# Patient Record
Sex: Female | Born: 1955
Health system: Southern US, Community
[De-identification: ages and names within clinical notes are randomized; demographics above are authoritative.]

## PROBLEM LIST (undated history)

## (undated) DIAGNOSIS — E871 Hypo-osmolality and hyponatremia: Secondary | ICD-10-CM

## (undated) DIAGNOSIS — A6920 Lyme disease, unspecified: Secondary | ICD-10-CM

## (undated) DIAGNOSIS — D6800 Von Willebrand disease, unspecified: Secondary | ICD-10-CM

## (undated) DIAGNOSIS — C50919 Malignant neoplasm of unspecified site of unspecified female breast: Secondary | ICD-10-CM

## (undated) DIAGNOSIS — E039 Hypothyroidism, unspecified: Secondary | ICD-10-CM

## (undated) DIAGNOSIS — Z9189 Other specified personal risk factors, not elsewhere classified: Secondary | ICD-10-CM

## (undated) DIAGNOSIS — C801 Malignant (primary) neoplasm, unspecified: Secondary | ICD-10-CM

## (undated) DIAGNOSIS — I10 Essential (primary) hypertension: Secondary | ICD-10-CM

## (undated) HISTORY — DX: Von Willebrand disease, unspecified: D68.00

## (undated) HISTORY — DX: Von Willebrand's disease: D68.0

## (undated) HISTORY — DX: Lyme disease, unspecified: A69.20

## (undated) HISTORY — PX: BREAST LUMPECTOMY: SHX2

## (undated) HISTORY — DX: Other specified personal risk factors, not elsewhere classified: Z91.89

## (undated) HISTORY — PX: TONSILLECTOMY: SUR1361

---

## 1998-08-01 ENCOUNTER — Other Ambulatory Visit: Admission: RE | Admit: 1998-08-01 | Discharge: 1998-08-01 | Payer: Self-pay | Admitting: Obstetrics and Gynecology

## 1999-08-13 ENCOUNTER — Other Ambulatory Visit: Admission: RE | Admit: 1999-08-13 | Discharge: 1999-08-13 | Payer: Self-pay | Admitting: Obstetrics and Gynecology

## 2000-08-13 ENCOUNTER — Other Ambulatory Visit: Admission: RE | Admit: 2000-08-13 | Discharge: 2000-08-13 | Payer: Self-pay | Admitting: Obstetrics and Gynecology

## 2001-01-21 ENCOUNTER — Encounter: Payer: Self-pay | Admitting: Infectious Diseases

## 2001-01-21 ENCOUNTER — Ambulatory Visit (HOSPITAL_COMMUNITY): Admission: RE | Admit: 2001-01-21 | Discharge: 2001-01-21 | Payer: Self-pay | Admitting: Infectious Diseases

## 2001-04-27 ENCOUNTER — Ambulatory Visit (HOSPITAL_COMMUNITY): Admission: RE | Admit: 2001-04-27 | Discharge: 2001-04-27 | Payer: Self-pay | Admitting: Infectious Diseases

## 2001-04-27 ENCOUNTER — Encounter: Payer: Self-pay | Admitting: Infectious Diseases

## 2001-08-15 ENCOUNTER — Other Ambulatory Visit: Admission: RE | Admit: 2001-08-15 | Discharge: 2001-08-15 | Payer: Self-pay | Admitting: Obstetrics and Gynecology

## 2002-03-01 ENCOUNTER — Ambulatory Visit (HOSPITAL_COMMUNITY): Admission: RE | Admit: 2002-03-01 | Discharge: 2002-03-01 | Payer: Self-pay | Admitting: Infectious Diseases

## 2002-03-01 ENCOUNTER — Encounter: Payer: Self-pay | Admitting: Infectious Diseases

## 2002-09-19 ENCOUNTER — Other Ambulatory Visit: Admission: RE | Admit: 2002-09-19 | Discharge: 2002-09-19 | Payer: Self-pay | Admitting: Obstetrics and Gynecology

## 2003-01-23 ENCOUNTER — Encounter: Payer: Self-pay | Admitting: Infectious Diseases

## 2003-01-23 ENCOUNTER — Ambulatory Visit (HOSPITAL_COMMUNITY): Admission: RE | Admit: 2003-01-23 | Discharge: 2003-01-23 | Payer: Self-pay | Admitting: Infectious Diseases

## 2003-09-20 ENCOUNTER — Other Ambulatory Visit: Admission: RE | Admit: 2003-09-20 | Discharge: 2003-09-20 | Payer: Self-pay | Admitting: Obstetrics and Gynecology

## 2004-06-11 ENCOUNTER — Encounter: Admission: RE | Admit: 2004-06-11 | Discharge: 2004-06-11 | Payer: Self-pay | Admitting: General Surgery

## 2004-06-11 ENCOUNTER — Ambulatory Visit (HOSPITAL_COMMUNITY): Admission: RE | Admit: 2004-06-11 | Discharge: 2004-06-11 | Payer: Self-pay | Admitting: General Surgery

## 2004-06-13 ENCOUNTER — Ambulatory Visit: Payer: Self-pay | Admitting: Oncology

## 2004-07-04 ENCOUNTER — Ambulatory Visit (HOSPITAL_BASED_OUTPATIENT_CLINIC_OR_DEPARTMENT_OTHER): Admission: RE | Admit: 2004-07-04 | Discharge: 2004-07-04 | Payer: Self-pay | Admitting: General Surgery

## 2004-07-30 ENCOUNTER — Ambulatory Visit (HOSPITAL_COMMUNITY): Admission: RE | Admit: 2004-07-30 | Discharge: 2004-07-30 | Payer: Self-pay

## 2004-07-30 ENCOUNTER — Encounter (INDEPENDENT_AMBULATORY_CARE_PROVIDER_SITE_OTHER): Payer: Self-pay | Admitting: Specialist

## 2004-08-18 ENCOUNTER — Ambulatory Visit: Payer: Self-pay | Admitting: Oncology

## 2004-08-26 ENCOUNTER — Ambulatory Visit: Admission: RE | Admit: 2004-08-26 | Discharge: 2004-11-02 | Payer: Self-pay | Admitting: Radiation Oncology

## 2004-09-24 ENCOUNTER — Other Ambulatory Visit: Admission: RE | Admit: 2004-09-24 | Discharge: 2004-09-24 | Payer: Self-pay | Admitting: Obstetrics and Gynecology

## 2004-10-28 ENCOUNTER — Ambulatory Visit: Payer: Self-pay | Admitting: Oncology

## 2004-11-07 ENCOUNTER — Ambulatory Visit: Payer: Self-pay | Admitting: Internal Medicine

## 2004-11-18 ENCOUNTER — Ambulatory Visit: Payer: Self-pay | Admitting: Internal Medicine

## 2004-11-27 ENCOUNTER — Ambulatory Visit: Admission: RE | Admit: 2004-11-27 | Discharge: 2004-11-27 | Payer: Self-pay | Admitting: Radiation Oncology

## 2005-09-28 ENCOUNTER — Other Ambulatory Visit: Admission: RE | Admit: 2005-09-28 | Discharge: 2005-09-28 | Payer: Self-pay | Admitting: Obstetrics and Gynecology

## 2006-04-13 ENCOUNTER — Ambulatory Visit: Payer: Self-pay | Admitting: Internal Medicine

## 2007-08-03 ENCOUNTER — Other Ambulatory Visit: Admission: RE | Admit: 2007-08-03 | Discharge: 2007-08-03 | Payer: Self-pay | Admitting: *Deleted

## 2008-07-21 IMAGING — US US SOFT TISSUE HEAD/NECK
1 series · 13 of 25 positions shown · non-contrast
Comparison: NONE

CLINICAL DATA: Choking sensation.  Evaluate for enlarged 
thyroid.  

THYROID ULTRASOUND

[Series 1: us thyroid · 0.06mm/px · 13 of 36 slices shown]
[im 1/36]
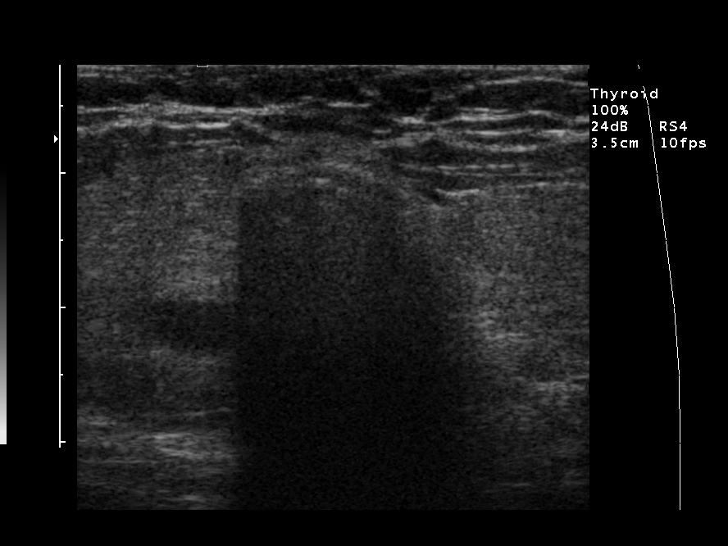
[im 3/36]
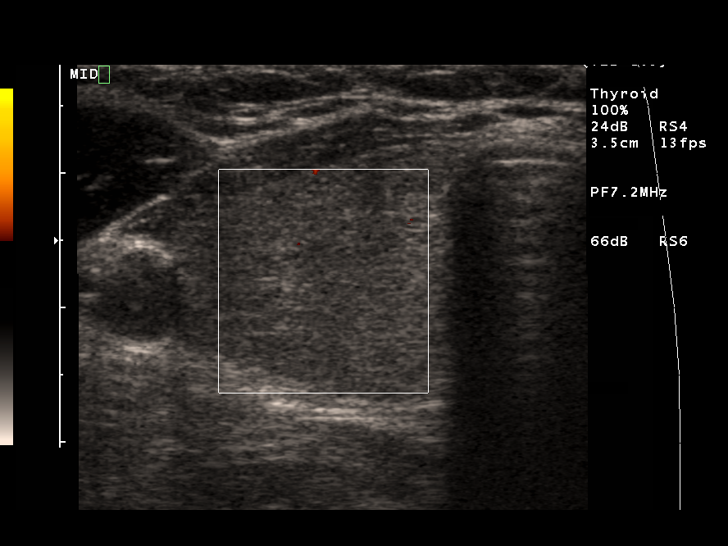
[im 6/36]
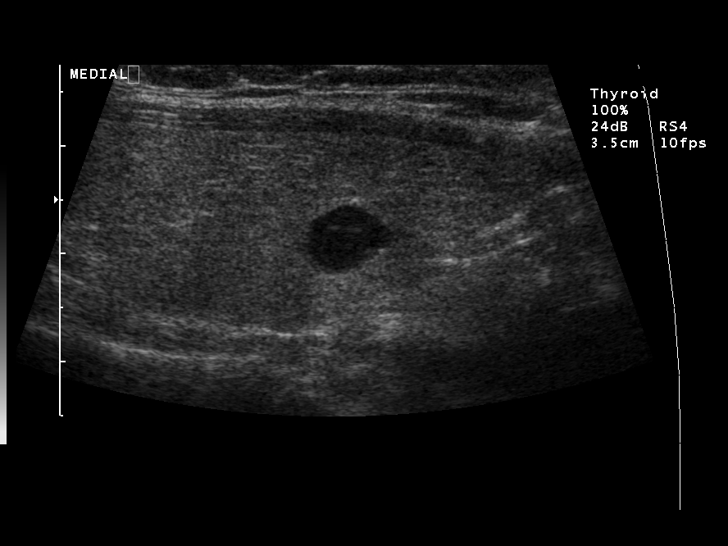
[im 9/36]
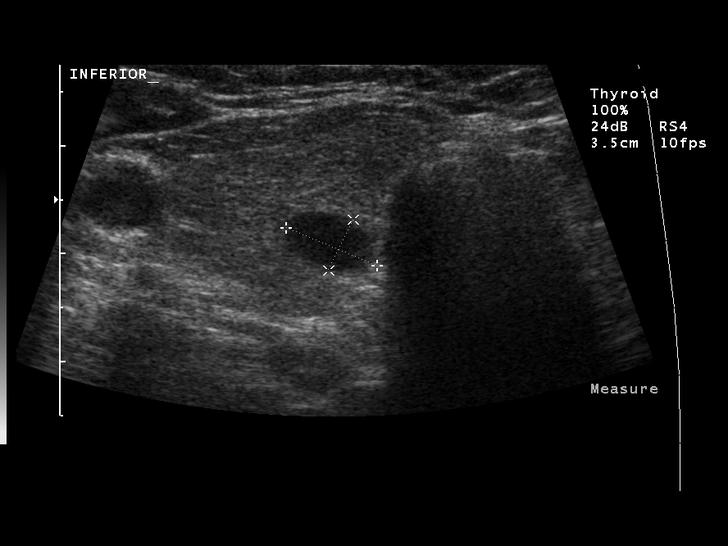
[im 12/36]
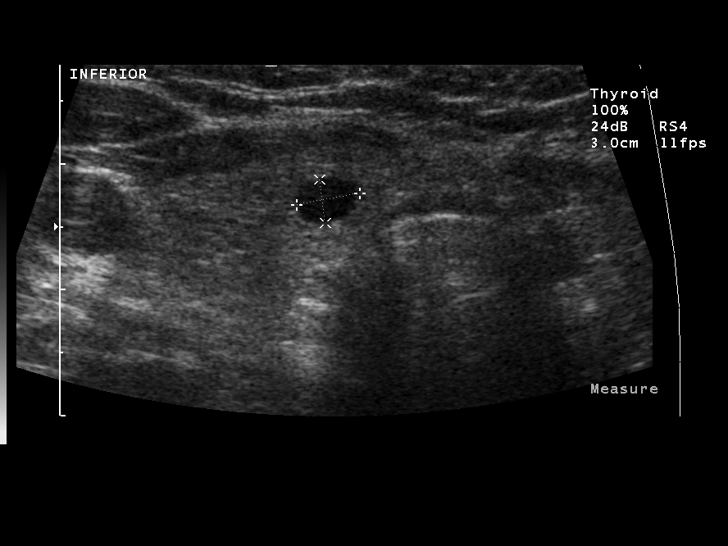
[im 15/36]
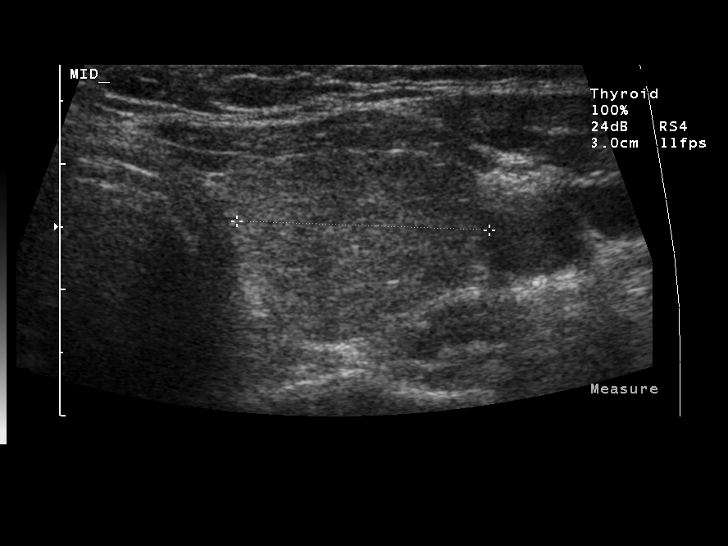
[im 18/36]
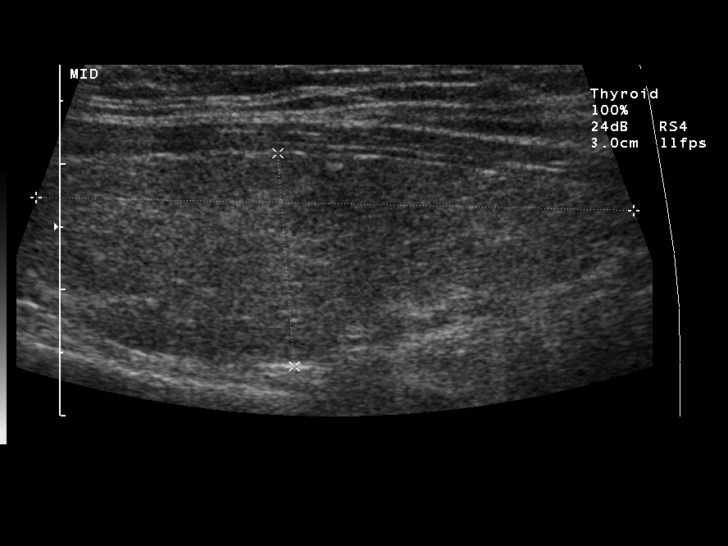
[im 21/36]
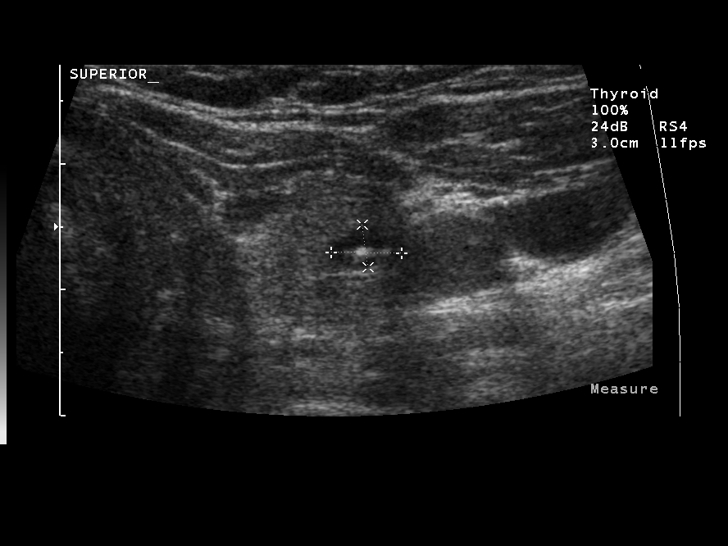
[im 24/36]
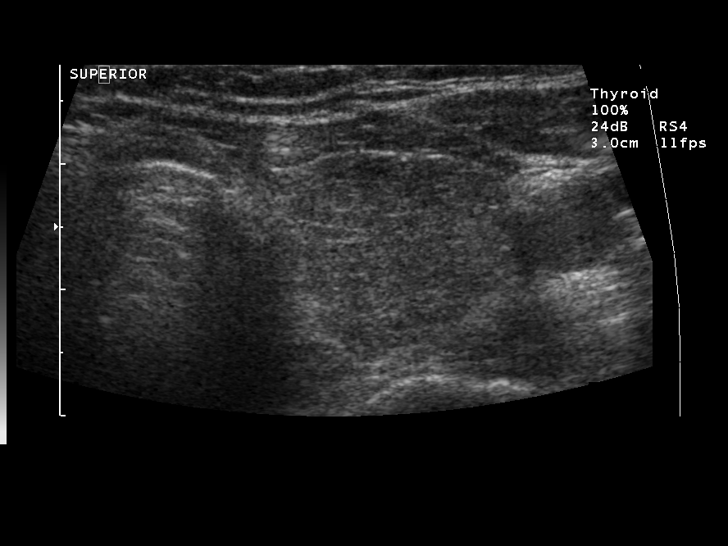
[im 27/36]
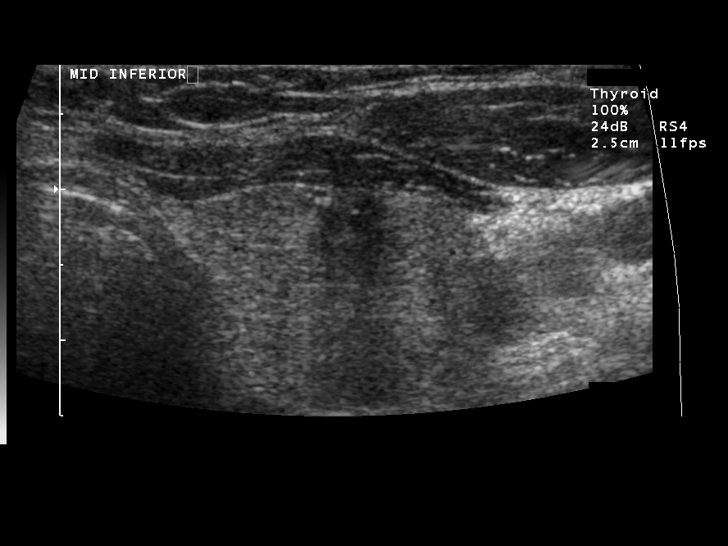
[im 30/36]
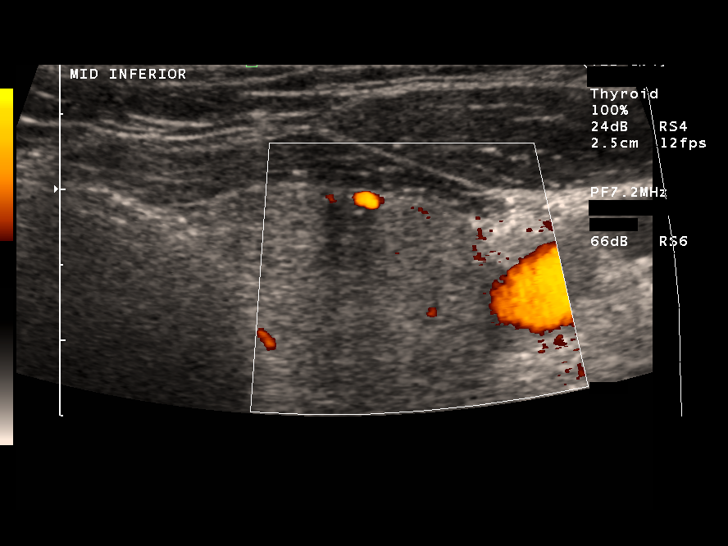
[im 33/36]
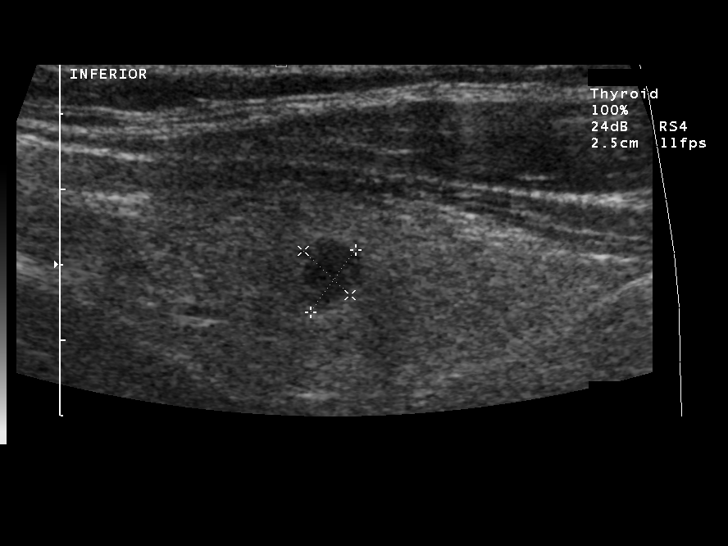
[im 36/36]
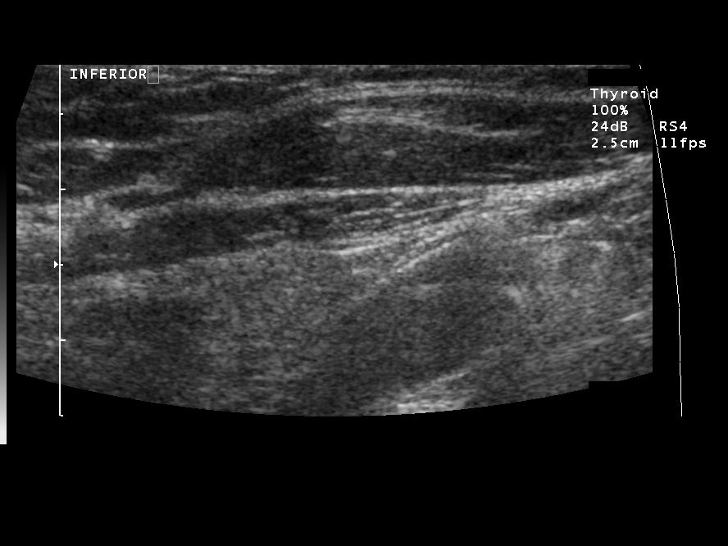

[13 of 25 positions shown; findings below may reference images not displayed]

FINDINGS: There are no prior ultrasound studies of the thyroid 
gland for comparison. Right lobe:  5.7x4.0x4.4 cm.  There is a 
hypoechoic area seen in the lower pole of the right lobe that 
measures approximately 7.9x8.9x09.9 mm.  There is a second 
hypoechoic lesion seen in the lower pole of the right lobe that 
measures 5.9x6.9x5.9 mm. Left lobe:  7.6x8.1x5.5 cm.   There is a 
hypoechoic area seen in the upper pole that measures 9.4x6.4x9.4 
mm.  This has a a hyperechoic area seen within it.  This is 
well-circumscribed.  There is an ill-defined hypoechoic area seen 
in the mid-to-lower pole that measures approximately 1.4x7.4 mm on 
axial image and approximately 4.0 mm on the sagittal image.  There 
is a second hypoechoic lesion seen in the lower pole of the left 
lobe that is noted and has a slightly irregular, but well-defined 
margin.  This measures 8.9x2.9x8.9 mm.
IMPRESSION: Thyroid gland appears to be upper limits of normal in 
size. There are multiple hypoechoic lesions seen in each lobe of 
the thyroid.  In the right lobe, these appear to be complex cysts. 
 In the left lobe, there is a lesion in the lower pole that may be 
a complex, but the other lesions appear to be solid.  I would 
recommend a follow-up thyroid ultrasound in 6 months to assess 
stability of these lesions, particularly the ill-defined 
hypoechoic lesion in the left lobe. Gala, Kimpee 
electronically reviewed on 06/02/2006 Dict Date: 06/02/2006  Tran 
Date: 06/02/2006 DAS  JLM

## 2010-06-22 ENCOUNTER — Encounter: Payer: Self-pay | Admitting: General Surgery

## 2010-08-26 ENCOUNTER — Other Ambulatory Visit: Payer: Self-pay | Admitting: Obstetrics and Gynecology

## 2010-10-17 NOTE — Assessment & Plan Note (Signed)
[facility]                           GASTROENTEROLOGY OFFICE NOTE   NAME:Marks, CRISS BARTLES                    MRN:          [id number]  DATE:04/13/2006                            DOB:          1956/03/02    CHIEF COMPLAINT:  Feeling of lump in throat, hoarseness, heartburn.   HISTORY:  A 55 year old self-referred [place] woman that I have seen for  screening colonoscopy recently.  For the past 4 months, she has had an  intermittent sensation of a lump in her throat.  There is no real dysphagia.  She has intermittent heartburn symptoms as well.  She just finished a 14-day  course of Prilosec OTC that seemed to help the heartburn but not the lump in  the throat.  There is some occasional postnasal drip but not a chronic  problem, some occasional allergies.  She relates she had problems similar to  this in 1998, when she had a problem with Lyme disease and even had a CT  scan of the neck, which is unrevealing.  She has not had problems since.  There is no weight loss or bleeding.   MEDICATIONS:  1. Aromasin 25 mg daily.  2. Mentax daily.  3. Propranolol 160 mg daily.  4. Armor thyroid 30 mg daily.  5. Provigil p.r.n. up to 200 mg.   DRUG ALLERGIES:  NONE KNOWN.   PAST MEDICAL HISTORY:  Breast cancer January of 2006, hypertension,  dyslipidemia, hypothyroidism, osteoarthritis.   PAST SURGICAL HISTORY:  Breast cancer surgery, colonoscopy showing mixed  hemorrhoid June 20, 2004.   FAMILY HISTORY:  Diabetes in her mother, heart disease in her mother.   SOCIAL HISTORY:  Married, lives with her husband and daughters.  She is a  disability advocate.  No alchohol, tobacco or drugs.   REVIEW OF SYSTEMS:  See medical history form for full details.  She has had  a little bit of hoarseness and sore throat.  At times, she cannot correlate  that with any particular events, however.   PHYSICAL EXAMINATION:  GENERAL:  Reveals a well-developed,  overweight to  obese [place] woman.  VITAL SIGNS:  Height 5 feet 10, weight 214 pounds, blood pressure 142/98,  pulse 71.  HEENT:  Eyes anicteric.  ENT:  Normal oropharynx.  NECK:  Supple, no rash.  CHEST:  Clear.  ABDOMEN:  Soft, nontender, no rebound or mass.  EXTREMITIES:  No edema.  SKIN:  No rash.  NEURO:  She is alert and oriented x3.   ASSESSMENT:  Globus sensation and heartburn problems.  The globus could be  from reflux.  I specifically asked her about anxiety, and I do not get a  sense that is part of it, though that can be related.  Postnasal drip  problems could be related.   PLAN:  1. Nexium 40 mg daily, taken before breakfast.  2. After 2 months of treatment, she will follow up, sooner if she develops      swallowing problems or bleeding.  If the symptoms resolve with the      Nexium, I think we have our answer.  Iva Boop, MD,FACG  Electronically Signed    CEG/MedQ  DD: 04/13/2006  DT: 04/13/2006  Job #: [id number]

## 2010-10-17 NOTE — Op Note (Signed)
NAME:  Catherine Robbins, Catherine Robbins             ACCOUNT NO.:  [id number]   MEDICAL RECORD NO.:  [id number]          PATIENT TYPE:  OIB   LOCATION:  2856                         FACILITY:  [facility]   PHYSICIAN:  Lorre Munroe., M.D.DATE OF BIRTH:  11/08/1955   DATE OF PROCEDURE:  07/30/2004  DATE OF DISCHARGE:                                 OPERATIVE REPORT   REFERRING PHYSICIAN:  Timothy E. Earlene Plater, M.D.   PREOPERATIVE DIAGNOSIS:  Carcinoma of the right breast.   POSTOPERATIVE DIAGNOSIS:  Carcinoma of the right breast.   OPERATION:  Right partial mastectomy and sentinel lymph node biopsy with  blue dye injection.   SURGEON:  Lebron Conners, M.D.   ANESTHESIA:  General and local.   PROCEDURE:  After the patient was monitored and anesthetized. I injected the  blue dye beneath the nipple and areola of the right breast and it was  massaged to promote drainage to the axilla. I used the Neoprobe to assure  that there was a area of increased radioactivity in the right axilla. After  routine preparation and draping of the right anterior chest, breast and  axilla, I made a short low axillary incision over the area of greatest  activity and dissected toward the radioactivity and found a large blue lymph  node which was highly radioactive and I removed it and sent it for  examination. I did not find any persistent area of increased radioactivity  in the axilla. I closed the deeper tissues of the axilla 4-0 Vicryl and skin  with running intracuticular 4-0 Vicryl. Dr. Delila Spence reported that imprint  cytology was negative for cancer. We then made a transverse incision at the  site of wire localization in the medial aspect of the right breast and  developed skin and subcutaneous flaps which were quite thin laterally for  about 3 cm in each direction. I incised straight down to the chest wall  medially and then dissected down skin flaps toward the middle part of the  breast and then deepened the dissection  making sure that I entirely excised  the localizing wire. I got hemostasis with cautery and closed that incision  with intracuticular 4-0 Vicryl and Steri-Strips after injecting  local anesthetic. I then also injected local anesthetic in the axillary  incision as well. Dr. Yolanda Bonine reported that the margins looked good and  that the abnormality was contained well within the biopsy specimen. Dr.  Delila Spence reported that the margin touch preps were negative for tumor. The the  patient tolerated the operation well.      WB/MEDQ  D:  07/30/2004  T:  07/30/2004  Job:  [id number]   cc:   Sheppard Plumber. Earlene Plater, M.D.  [address]  [place]  [place] [address]   Jeralyn Ruths, M.D.   [facility]  [address]  [place]  [place] [address]  Fax: [phone]   [facility]   Macarthur Critchley. Shelva Majestic, M.D.  [address] [place]  [address]  [place]  [place] [address]  Fax: [phone]

## 2011-04-20 ENCOUNTER — Telehealth: Payer: Self-pay | Admitting: *Deleted

## 2011-04-20 NOTE — Telephone Encounter (Signed)
Chart opened in error

## 2012-12-07 ENCOUNTER — Other Ambulatory Visit: Payer: Self-pay | Admitting: Family Medicine

## 2012-12-07 DIAGNOSIS — E049 Nontoxic goiter, unspecified: Secondary | ICD-10-CM

## 2012-12-09 ENCOUNTER — Ambulatory Visit
Admission: RE | Admit: 2012-12-09 | Discharge: 2012-12-09 | Disposition: A | Discharge status: Home / Self Care | Payer: BC Managed Care – PPO | Source: Ambulatory Visit | Attending: Family Medicine | Admitting: Family Medicine

## 2012-12-09 DIAGNOSIS — E049 Nontoxic goiter, unspecified: Secondary | ICD-10-CM

## 2012-12-29 ENCOUNTER — Other Ambulatory Visit: Payer: Self-pay

## 2014-01-23 ENCOUNTER — Other Ambulatory Visit: Payer: Self-pay | Admitting: Obstetrics and Gynecology

## 2014-01-23 DIAGNOSIS — R928 Other abnormal and inconclusive findings on diagnostic imaging of breast: Secondary | ICD-10-CM

## 2014-01-30 ENCOUNTER — Ambulatory Visit
Admission: RE | Admit: 2014-01-30 | Discharge: 2014-01-30 | Disposition: A | Discharge status: Home / Self Care | Payer: BC Managed Care – PPO | Source: Ambulatory Visit | Attending: Obstetrics and Gynecology | Admitting: Obstetrics and Gynecology

## 2014-01-30 DIAGNOSIS — R928 Other abnormal and inconclusive findings on diagnostic imaging of breast: Secondary | ICD-10-CM

## 2014-08-14 ENCOUNTER — Other Ambulatory Visit: Payer: Self-pay | Admitting: Obstetrics and Gynecology

## 2014-08-14 DIAGNOSIS — N63 Unspecified lump in unspecified breast: Secondary | ICD-10-CM

## 2014-08-22 ENCOUNTER — Ambulatory Visit
Admission: RE | Admit: 2014-08-22 | Discharge: 2014-08-22 | Disposition: A | Discharge status: Home / Self Care | Payer: BC Managed Care – PPO | Source: Ambulatory Visit | Attending: Obstetrics and Gynecology | Admitting: Obstetrics and Gynecology

## 2014-08-22 DIAGNOSIS — N63 Unspecified lump in unspecified breast: Secondary | ICD-10-CM

## 2015-01-01 IMAGING — US US SOFT TISSUE HEAD/NECK
1 series · 14 of 25 positions shown · non-contrast
Comparison: None.

CLINICAL DATA: Thyroid goiter, follow-up

THYROID ULTRASOUND
TECHNIQUE: Ultrasound examination of the thyroid gland and adjacent
soft tissues was performed.

[Series 1: us soft tissue head/neck · 0.07mm/px · 14 of 52 slices shown]
[im 1/52]
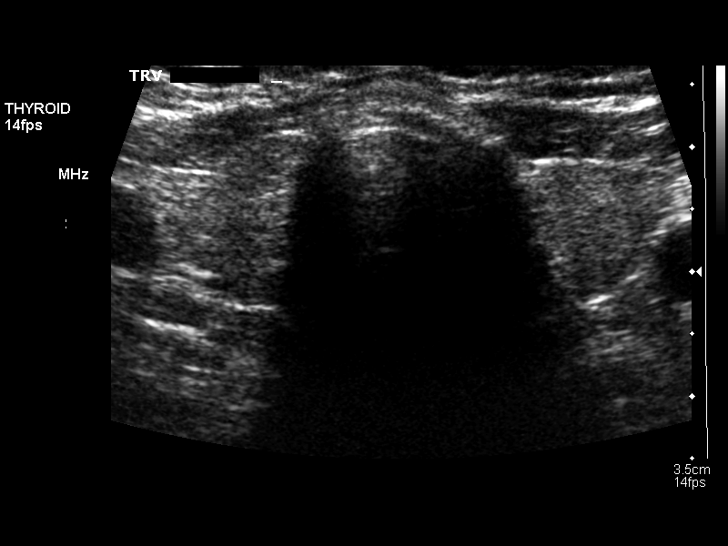
[im 5/52]
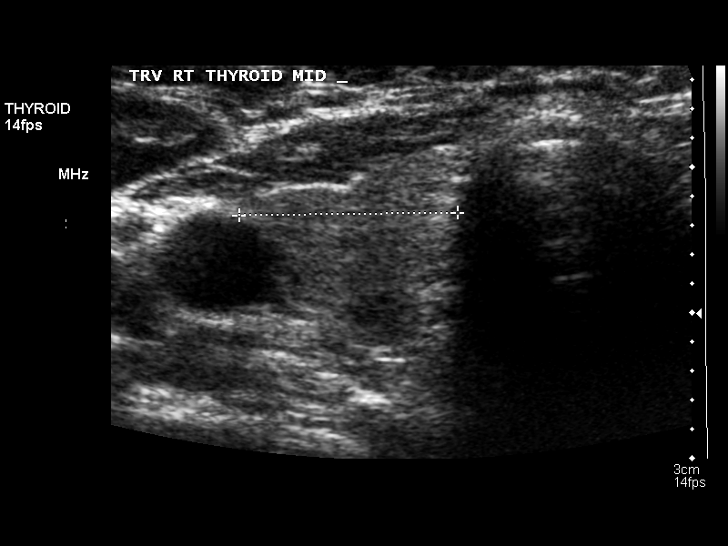
[im 9/52]
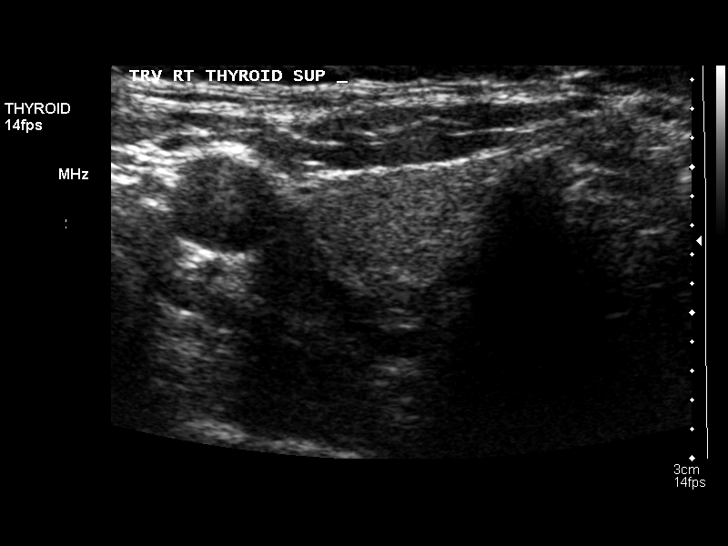
[im 13/52]
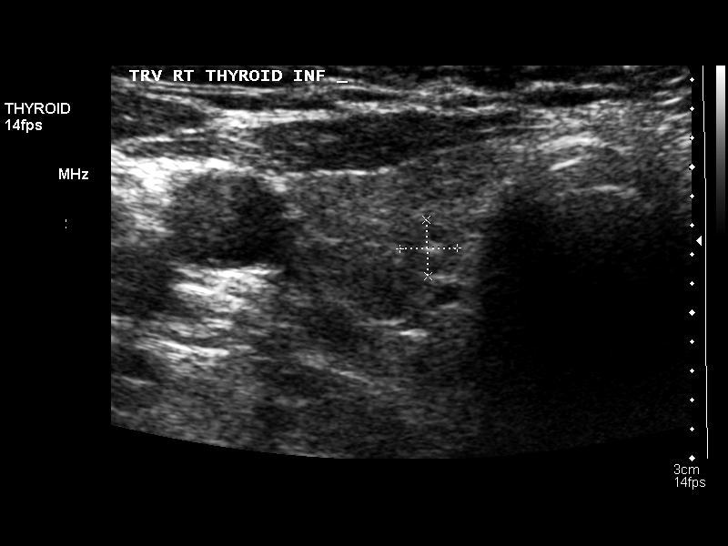
[im 18/52]
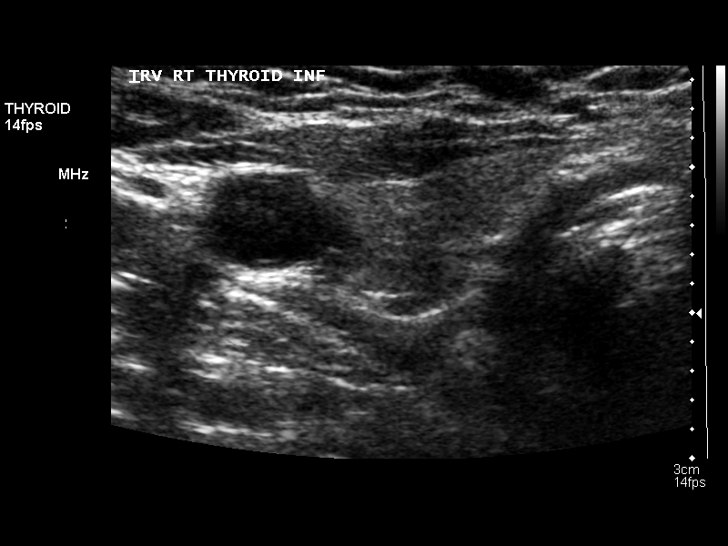
[im 20/52]
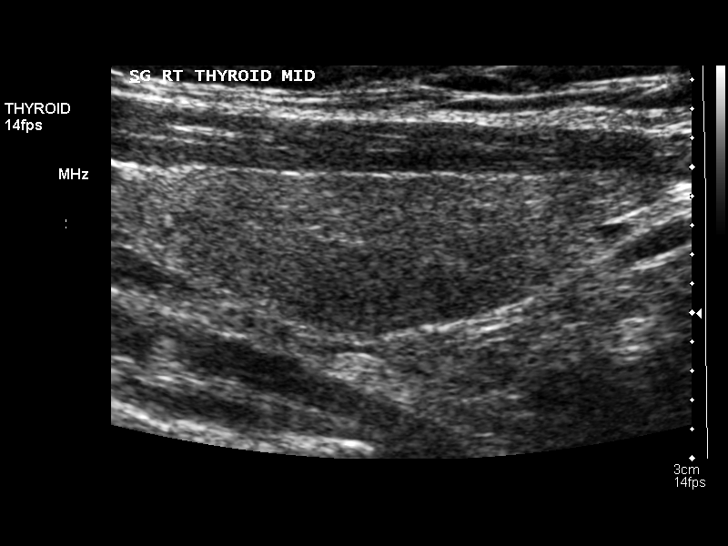
[im 24/52]
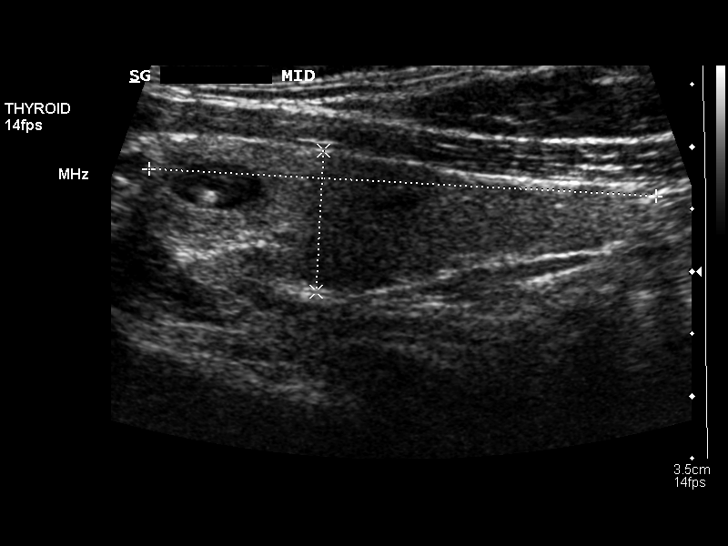
[im 28/52]
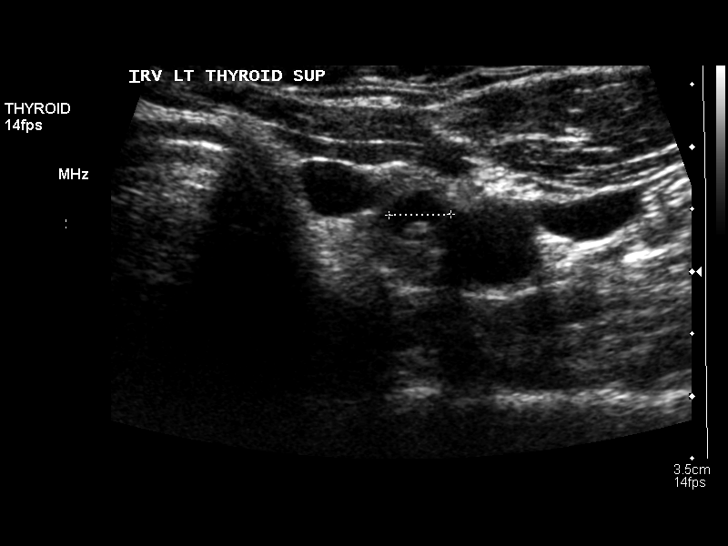
[im 32/52]
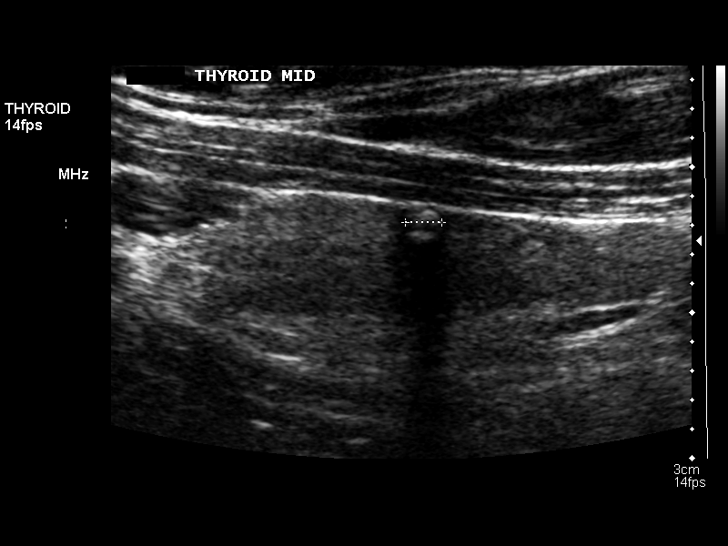
[im 35/52]
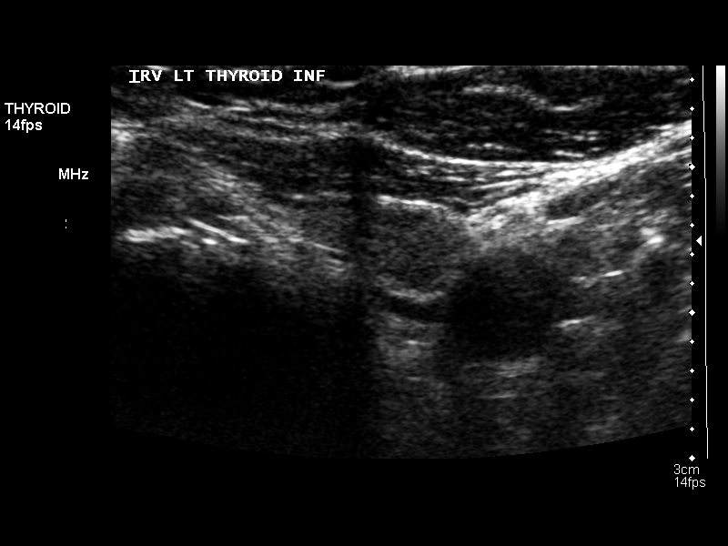
[im 39/52]
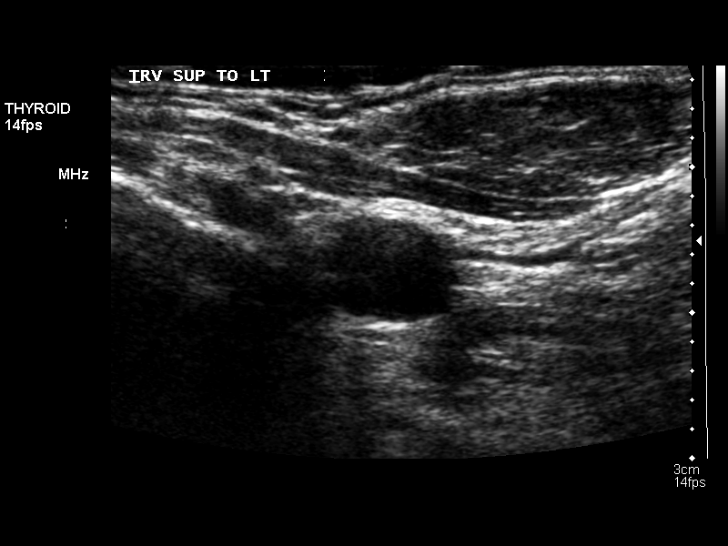
[im 43/52]
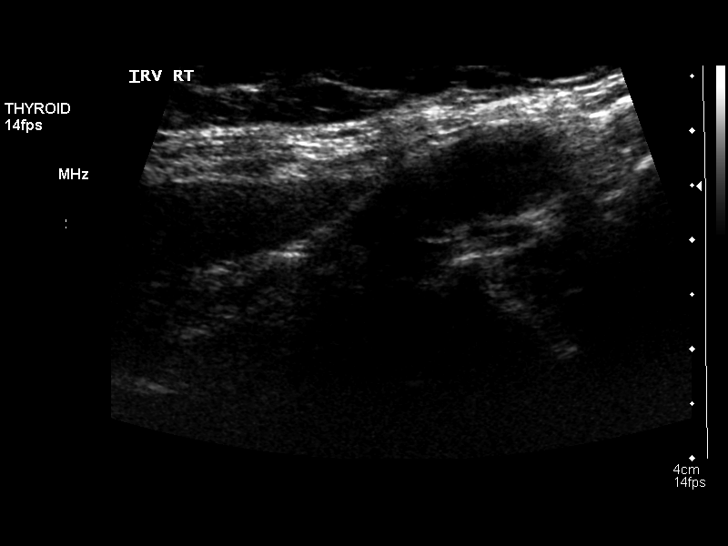
[im 47/52]
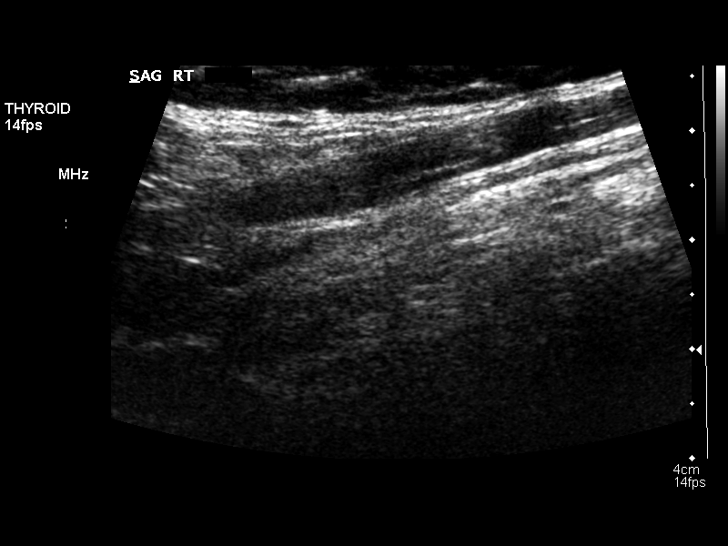
[im 52/52]
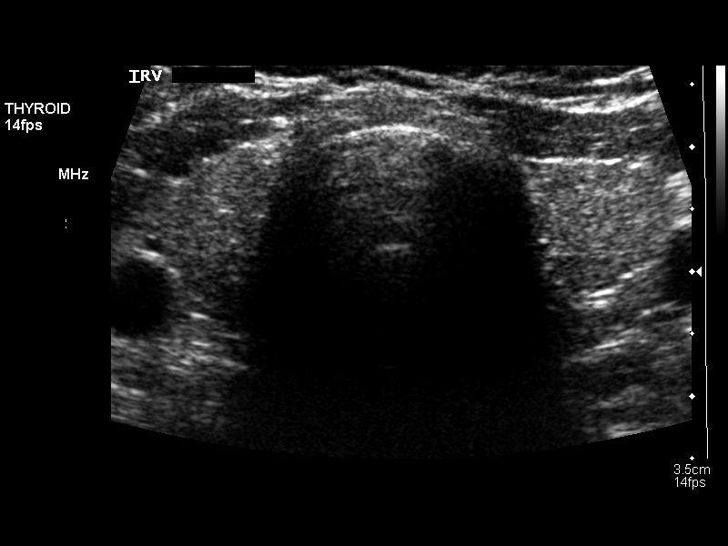

[14 of 25 positions shown; findings below may reference images not displayed]

FINDINGS: Right thyroid lobe:  3.8 x 1.3 x 1.5 cm.
Left thyroid lobe:  4.1 x 1.1 x 1.2 cm.
Isthmus:  2.1 mm in thickness.

Focal nodules:  The echogenicity of the thyroid parenchyma is
inhomogeneous.  Small nodules are noted bilaterally.  Largest
nodule is in the upper pole on the left and is hypoechoic measuring
5 mm in diameter.

Lymphadenopathy:  None visualized.
IMPRESSION: The thyroid gland is normal in size with only small nodules
bilaterally of no more than 5 mm in diameter.

## 2015-01-30 ENCOUNTER — Encounter: Payer: Self-pay | Admitting: Internal Medicine

## 2015-02-01 ENCOUNTER — Encounter: Payer: Self-pay | Admitting: Internal Medicine

## 2015-08-06 ENCOUNTER — Other Ambulatory Visit: Payer: Self-pay | Admitting: Family Medicine

## 2015-08-06 ENCOUNTER — Other Ambulatory Visit (HOSPITAL_COMMUNITY)
Admission: RE | Admit: 2015-08-06 | Discharge: 2015-08-06 | Disposition: A | Discharge status: Home / Self Care | Payer: BC Managed Care – PPO | Source: Ambulatory Visit | Attending: Family Medicine | Admitting: Family Medicine

## 2015-08-06 DIAGNOSIS — Z01419 Encounter for gynecological examination (general) (routine) without abnormal findings: Secondary | ICD-10-CM | POA: Diagnosis not present

## 2015-08-09 LAB — CYTOLOGY - PAP

## 2016-09-17 ENCOUNTER — Other Ambulatory Visit: Payer: Self-pay | Admitting: Physician Assistant

## 2016-09-17 DIAGNOSIS — M5412 Radiculopathy, cervical region: Secondary | ICD-10-CM

## 2016-09-30 ENCOUNTER — Other Ambulatory Visit: Payer: BC Managed Care – PPO

## 2017-08-17 ENCOUNTER — Other Ambulatory Visit: Payer: Self-pay | Admitting: Radiology

## 2017-08-18 ENCOUNTER — Other Ambulatory Visit: Payer: Self-pay | Admitting: Oncology

## 2017-08-18 ENCOUNTER — Telehealth: Payer: Self-pay | Admitting: Oncology

## 2017-08-18 NOTE — Telephone Encounter (Signed)
Spoke with patient to confirm afternoon [facility] appointment for 3/27, [facility] patient no packet sent, appointment letter mailed

## 2017-08-20 ENCOUNTER — Other Ambulatory Visit: Payer: Self-pay | Admitting: *Deleted

## 2017-08-20 DIAGNOSIS — Z170 Estrogen receptor positive status [ER+]: Principal | ICD-10-CM

## 2017-08-20 DIAGNOSIS — C50411 Malignant neoplasm of upper-outer quadrant of right female breast: Secondary | ICD-10-CM | POA: Insufficient documentation

## 2017-08-24 NOTE — Progress Notes (Signed)
[place]  [phone] Fax:(336) [phone]     ID: Catherine Robbins DOB: [birth year]  MR#: [id number]  JYN#:[id number]  Patient Care Team: Carol Ada, MD as PCP - General (Family Medicine) Jovita Kussmaul, MD as Consulting Physician (General Surgery) Magrinat, Virgie Dad, MD as Consulting Physician (Oncology) Kyung Rudd, MD as Consulting Physician (Radiation Oncology) Milly Jakob, MD as Consulting Physician (Orthopedic Surgery) OTHER MD:  CHIEF COMPLAINT: Estrogen receptor positive breast cancer  CURRENT TREATMENT: Awaiting definitive surgery/neoadjuvant anastrozole   HISTORY OF CURRENT ILLNESS: Catherine Robbins has a history of prior right-sided breast cancer, status post lumpectomy on 07/30/2004 for a 1.1 cm invasive ductal carcinoma, grade 1, with negative margins and a single sentinel lymph node clear.  The tumor was estrogen receptor 93% positive, progesterone receptor 83% positive, with an MIB-1 of 7% and no HER-2 amplification ([id number]).  She received adjuvant radiation radiation. She took tamoxifen for only a month due to poor tolerance, including severe hot flashes. She was switched to exemestane and tolerated this well, taking it for a total of 5 years  More recently she had routine screening mammography on 08/11/2017 showing a possible abnormality in the right breast. She underwent unilateral right diagnostic mammography with tomography and right breast ultrasonography at [facility] on 08/13/2017 showing: breast density category A. By ultrasound, the new 2.6 cm irregular mass in the 11 o'clock right upper outer quadrant  posterior depth located 4 cm from the nipple is likely carcinoma. Additionally, there is a 0.4 cm irregular mass in the right upper outer quadrant anterior depth and 3 cm from the nipple. These masses correlated to mammography findings. Evaluation of the right axilla was unremarkable.  Accordingly on 08/17/2017 she proceeded to biopsy  of the right breast mass in question. The pathology from this procedure showed ([id number]): At the 11 o'clock 4 cmfn, invasive ductal carcinoma, grade II. Prognostic indicators significant for: estrogen receptor, 90% positive and progesterone receptor, 100% positive, both with strong staining intensity. Proliferation marker Ki67 at 25%. HER2 not amplified with ratios HER2/CEP17 signals 1.28 and average HER2 copies per cell 2.09.   The patient's subsequent history is as detailed below.  INTERVAL HISTORY: "Catherine Robbins" was evaluated in the multidisciplinary breast cancer clinic on 08/25/2017 accompanied by her husband, [name]. Her case was also presented at the multidisciplinary breast cancer conference on the same day. At that time a preliminary plan was proposed: Mastectomy, consideration of Oncotype, genetics referral   REVIEW OF SYSTEMS: There were no specific symptoms leading to the original mammogram, which was routinely scheduled. She gets steroid injections in her left wrist for arthritis. She is working on her diet in order to decrease her cholesterol. Catherine Robbins also works out and stay very active. She runs and walks about 15 miles per week. She takes exercise and fitness classes at the [facility]. The patient denies unusual headaches, visual changes, nausea, vomiting, stiff neck, dizziness, or gait imbalance. There has been no cough, phlegm production, or pleurisy, no chest pain or pressure, and no change in bowel or bladder habits. The patient denies fever, rash, bleeding, unexplained fatigue or unexplained weight loss. A detailed review of systems was otherwise entirely negative.   PAST MEDICAL HISTORY: Past Medical History:  Diagnosis Date  . 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 Lyme disease   . 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 She is a DES Daughter.   PAST  SURGICAL HISTORY: Past Surgical History:  Procedure Laterality Date  . BREAST LUMPECTOMY Right   . TONSILLECTOMY    Wisdom teeth extraction in the 1970's   FAMILY HISTORY No family history on file.  The patient's father is alive at age 23. The patient's mother died at age 1 from heart disease. The patient has 1 brother and 2 sisters. She denies a history of breast, ovarian, or any other cancers in the family.   GYNECOLOGIC HISTORY:  No LMP recorded. Menarche: 92-23 years old Age at first live birth: 63 years old She is GXP2. Her LMP was in 2006. She used oral contraceptives for over 5 years with no complications. She also used HRT around 2005 for about a year until her initial breast cancer diagnosis in 2006.      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 She is also in the process of retiring. Her husband, [name], is retired from Investment banker, corporate. The patient's daughter, Mickel Baas, is a Cabin crew in [place], [place]. The patient's second daughter, Claiborne Billings, works in office and child care in [place]. She has no grandchildren. She is a person of faith, but is private.      ADVANCED DIRECTIVES:    HEALTH MAINTENANCE: Social History   Tobacco Use  . Smoking status: Former Research scientist (life sciences)  . Smokeless tobacco: Never Used  Substance Use Topics  . Alcohol use: Yes  . Drug use: Never     Colonoscopy: 2016/ in [place]  PAP: March 2017/ normal  Bone density: 2018 at Dr. Tamala Julian osteopenia    No Known Allergies  Current Outpatient Medications  Medication Sig Dispense Refill  . ALPRAZolam (XANAX) 0.25 MG tablet     . glucosamine-chondroitin 500-400 MG tablet Take 1 tablet by mouth 3 (three) times daily.    . Multiple Minerals-Vitamins (NUTRA-SUPPORT BONE PO) Take by mouth.    . NP THYROID 60 MG tablet     . propranolol (INDERAL) 20 MG tablet     . vitamin B-12 (CYANOCOBALAMIN) 1000 MCG  tablet Take 2,000 mcg by mouth daily.    . vitamin C (ASCORBIC ACID) 500 MG tablet Take 500 mg by mouth 2 (two) times daily.     No current facility-administered medications for this visit.     OBJECTIVE: Middle-aged [place] woman in no acute distress  Vitals:   08/25/17 1311  BP: (!) 163/99  Pulse: (!) 59  Resp: 18  Temp: 99 F (37.2 C)  SpO2: 100%     Body mass index is 26.26 kg/m.   Wt Readings from Last 3 Encounters:  08/25/17 183 lb (83 kg)      ECOG FS:0 - Asymptomatic  Ocular: Sclerae unicteric, pupils round and equal Ear-nose-throat: Oropharynx clear and moist Lymphatic: No cervical or supraclavicular adenopathy Lungs no rales or rhonchi Heart regular rate and rhythm Abd soft, nontender, positive bowel sounds MSK no focal spinal tenderness, no joint edema Neuro: non-focal, well-oriented, appropriate affect Breasts: The right breast is status post recent biopsy.  There is a mild ecchymosis.  The left breast is unremarkable.  Both axillae are benign.   LAB RESULTS:  CMP  No results found for: NA, K, CL, CO2, GLUCOSE, BUN, CREATININE, CALCIUM, PROT, ALBUMIN, AST, ALT, ALKPHOS, BILITOT, GFRNONAA, GFRAA  No results found for: TOTALPROTELP, ALBUMINELP, A1GS, A2GS, BETS, BETA2SER, GAMS, MSPIKE, SPEI  No results found for: KPAFRELGTCHN, LAMBDASER, KAPLAMBRATIO  No results  found for: WBC, NEUTROABS, HGB, HCT, MCV, PLT  @LASTCHEMISTRY @  No results found for: LABCA2  No components found for: VPXTGG269  No results for input(s): INR in the last 168 hours.  No results found for: LABCA2  No results found for: SWN462  No results found for: VOJ500  No results found for: XFG182  No results found for: CA2729  No components found for: HGQUANT  No results found for: CEA1 / No results found for: CEA1   No results found for: AFPTUMOR  No results found for: CHROMOGRNA  No results found for: PSA1  No visits with results within 3 Day(s) from this visit.  Latest  known visit with results is:  Orders Only on 08/06/2015  Component Date Value Ref Range Status  . CYTOLOGY - PAP 08/06/2015 PAP RESULT   Final    (this displays the last labs from the last 3 days)  No results found for: TOTALPROTELP, ALBUMINELP, A1GS, A2GS, BETS, BETA2SER, GAMS, MSPIKE, SPEI (this displays SPEP labs)  No results found for: KPAFRELGTCHN, LAMBDASER, KAPLAMBRATIO (kappa/lambda light chains)  No results found for: HGBA, HGBA2QUANT, HGBFQUANT, HGBSQUAN (Hemoglobinopathy evaluation)   No results found for: LDH  No results found for: IRON, TIBC, IRONPCTSAT (Iron and TIBC)  No results found for: FERRITIN  Urinalysis No results found for: COLORURINE, APPEARANCEUR, LABSPEC, PHURINE, GLUCOSEU, HGBUR, BILIRUBINUR, KETONESUR, PROTEINUR, UROBILINOGEN, NITRITE, LEUKOCYTESUR   STUDIES: Mammographic and ultrasound studies reviewed with the patient  ELIGIBLE FOR AVAILABLE RESEARCH PROTOCOL: no  ASSESSMENT: 62 y.o. [place], [place] woman  (1) status post right lumpectomy and sentinel lymph node sampling 07/30/2004 for a pT1c pN0, stage IA invasive breast cancer, grade 1, estrogen and progesterone receptor positive, HER-2 not amplified, with an MIB-1 of 7%  (a) s/p adjuvant radiation  (b) intolerant of tamoxifen  (c) on aromasin for 5 years w/o complications  (2) status post right breast upper outer quadrant biopsy 08/17/2017 for a clinical T2 N0, stage IB invasive ductal carcinoma, grade 2, estrogen and progesterone receptor positive, HER-2 not amplified, with an MIB-105%  (a) a second area of concern measuring 0.4 cm was located in the upper inner quadrant  (3) right mastectomy and sentinel lymph node sampling with immediate reconstruction planned  (4) Oncotype DX to be obtained from the definitive surgical sample  (5) anastrozole started neoadjuvantly in anticipation of some surgical delays  (6) genetics testing pending  (7) history of von Willebrand  disease  PLAN: We spent the better part of today's hour-long appointment discussing the biology of her diagnosis and the specifics of her situation. We first reviewed the fact that cancer is not one disease but more than 100 different diseases and that it is important to keep them separate-- otherwise when friends and relatives discuss their own cancer experiences with Catherine Robbins confusion can result. Similarly we explained that if breast cancer spreads to the bone or liver, the patient would not have bone cancer or liver cancer, but breast cancer in the bone and breast cancer in the liver: one cancer in three places-- not 3 different cancers which otherwise would have to be treated in 3 different ways.  We discussed the difference between local and systemic therapy. In terms of loco-regional treatment, lumpectomy plus radiation is equivalent to mastectomy as far as survival is concerned.  However since she has already had radiation to the right breast, mastectomy is the standard approach in situations like hers.    We then discussed the rationale for systemic therapy. There is some risk  that this cancer may have already spread to other parts of her body. Patients frequently ask at this point about bone scans, CAT scans and PET scans to find out if they have occult breast cancer somewhere else. The problem is that in early stage disease we are much more likely to find false positives then true cancers and this would expose the patient to unnecessary procedures as well as unnecessary radiation. Scans cannot answer the question the patient really would like to know, which is whether she has microscopic disease elsewhere in her body. For those reasons we do not recommend them.  Of course we would proceed to aggressive evaluation of any symptoms that might suggest metastatic disease, but that is not the case here.  Next we went over the options for systemic therapy which are anti-estrogens, anti-HER-2  immunotherapy, and chemotherapy.  Catherine Robbins does not meet criteria for anti-HER-2 immunotherapy. She is a good candidate for anti-estrogens.  The question of chemotherapy is more complicated. Chemotherapy is most effective in rapidly growing, aggressive tumors. It is much less effective in low-grade, slow growing cancers, like Nolyn 's. For that reason we are going to request an Oncotype from the definitive surgical sample, as suggested by NCCN guidelines. That will help [place] make a definitive decision regarding chemotherapy in this case.  The patient does carry a diagnosis of von Willebrand disease.  She did well with her prior breast surgery, with no bleeding complications, but she apparently had significant problems with teeth extractions in the remote past.  We will confer that from prior labs and also repeat a current panel.  She may benefit from preoperative DDAVP.  We will coordinate this with her surgeon Dr. Marlou Starks  Because it may be a little difficult to coordinate the surgery with plastics, and therefore there may be some delays, we went ahead and started aromatase inhibitors today.  She tolerated Aromasin previously without any side effects.  We are going to try anastrozole, which is usually much less expensive and I am calling in that prescription for her.  Of course if she has any problems she will call me.  Otherwise the plan will be to continue anastrozole for a total of 5 years.  Catherine Robbins has a good understanding of the overall plan. She agrees with it. She knows the goal of treatment in her case is cure. She will call with any problems that may develop before her next visit here.   Magrinat, Virgie Dad, MD  08/25/17 2:39 PM Medical Oncology and Hematology [facility] [address] [facility], [place] [address] Tel. [phone]    Fax. [phone]  This document serves as a record of services personally performed by Lurline Del, MD. It was created on his behalf by  Sheron Nightingale, a trained medical scribe. The creation of this record is based on the scribe's personal observations and the provider's statements to them.   I have reviewed the above documentation for accuracy and completeness, and I agree with the above.

## 2017-08-25 ENCOUNTER — Encounter: Payer: Self-pay | Admitting: General Practice

## 2017-08-25 ENCOUNTER — Encounter: Payer: Self-pay | Admitting: Physical Therapy

## 2017-08-25 ENCOUNTER — Other Ambulatory Visit: Payer: Self-pay

## 2017-08-25 ENCOUNTER — Encounter: Payer: Self-pay | Admitting: Oncology

## 2017-08-25 ENCOUNTER — Ambulatory Visit: Payer: BC Managed Care – PPO | Attending: General Surgery | Admitting: Physical Therapy

## 2017-08-25 ENCOUNTER — Inpatient Hospital Stay: Payer: BC Managed Care – PPO | Attending: Oncology | Admitting: Oncology

## 2017-08-25 ENCOUNTER — Other Ambulatory Visit: Payer: BC Managed Care – PPO

## 2017-08-25 ENCOUNTER — Ambulatory Visit: Payer: Self-pay | Admitting: General Surgery

## 2017-08-25 ENCOUNTER — Ambulatory Visit
Admission: RE | Admit: 2017-08-25 | Discharge: 2017-08-25 | Disposition: A | Discharge status: Home / Self Care | Payer: BC Managed Care – PPO | Source: Ambulatory Visit | Attending: Radiation Oncology | Admitting: Radiation Oncology

## 2017-08-25 VITALS — BP 163/99 | HR 59 | Temp 99.0°F | Resp 18 | Ht 70.0 in | Wt 183.0 lb

## 2017-08-25 DIAGNOSIS — Z79899 Other long term (current) drug therapy: Secondary | ICD-10-CM

## 2017-08-25 DIAGNOSIS — Z87891 Personal history of nicotine dependence: Secondary | ICD-10-CM

## 2017-08-25 DIAGNOSIS — E785 Hyperlipidemia, unspecified: Secondary | ICD-10-CM | POA: Diagnosis not present

## 2017-08-25 DIAGNOSIS — I10 Essential (primary) hypertension: Secondary | ICD-10-CM | POA: Diagnosis not present

## 2017-08-25 DIAGNOSIS — C50111 Malignant neoplasm of central portion of right female breast: Secondary | ICD-10-CM

## 2017-08-25 DIAGNOSIS — Z170 Estrogen receptor positive status [ER+]: Principal | ICD-10-CM

## 2017-08-25 DIAGNOSIS — Z853 Personal history of malignant neoplasm of breast: Secondary | ICD-10-CM | POA: Diagnosis not present

## 2017-08-25 DIAGNOSIS — C50411 Malignant neoplasm of upper-outer quadrant of right female breast: Secondary | ICD-10-CM

## 2017-08-25 DIAGNOSIS — D6800 Von Willebrand disease, unspecified: Secondary | ICD-10-CM

## 2017-08-25 DIAGNOSIS — R293 Abnormal posture: Secondary | ICD-10-CM | POA: Diagnosis present

## 2017-08-25 DIAGNOSIS — C50919 Malignant neoplasm of unspecified site of unspecified female breast: Secondary | ICD-10-CM | POA: Insufficient documentation

## 2017-08-25 MED ORDER — ANASTROZOLE 1 MG PO TABS: 1.0000 mg | ORAL_TABLET | Freq: Every day | ORAL | 4 refills | Status: DC

## 2017-08-25 NOTE — Therapy (Signed)
[place], [place], [address] Phone: [phone]   Fax:  [phone]  Physical Therapy Evaluation  Patient Details  Name: Catherine Robbins MRN: [id number] Date of Birth: Apr 05, 1956 Referring Provider: Dr. Autumn Messing   Encounter Date: 08/25/2017  PT End of Session - 08/25/17 2014    Visit Number  1    Number of Visits  2    Date for PT Re-Evaluation  10/20/17    PT Start Time  3149    PT Stop Time  1532    PT Time Calculation (min)  25 min    Activity Tolerance  Patient tolerated treatment well    Behavior During Therapy  [facility] for tasks assessed/performed       Past Medical History:  Diagnosis Date  . 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 Lyme disease   . Von Willebrand disease ([place])     Past Surgical History:  Procedure Laterality Date  . BREAST LUMPECTOMY Right   . TONSILLECTOMY      There were no vitals filed for this visit.   Subjective Assessment - 08/25/17 2006    Subjective  Patient reports she is here today to be seen by her medical team for her newly diagnosed right breast cancer.    Patient is accompained by:  Family member    Pertinent History  Patient was diagnosed on 08/11/17 with right grade 2 invasive ductal carcinoma breast cancer. It measures 2.6 cm and 6 mm in 2 areas in the upper outer quadrant. It is ER/PR positive and HER2 negative with a Ki67 of 25%. She has a history of a right mastectomy and sentinel node biopsy (0/1 nodes) and radiation in 3/06.    Patient Stated Goals  Reduce lymphedema risk and learn post op shoulder ROM HEP    Currently in Pain?  No/denies         [facility] PT Assessment - 08/25/17 0001      Assessment   Medical Diagnosis  Right breast cancer    Referring Provider  Dr. Autumn Messing    Onset Date/Surgical Date  08/11/17    Hand Dominance  Right    Prior Therapy  none      Precautions   Precautions  Other (comment)    Precaution Comments  active cancer and  already at risk for right arm lymphedema due to previous breast cancer surgery in 2006      Restrictions   Weight Bearing Restrictions  No      Balance Screen   Has the patient fallen in the past 6 months  No    Has the patient had a decrease in activity level because of a fear of falling?   No    Is the patient reluctant to leave their home because of a fear of falling?   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    Leisure  She runs, walks, and does classes 5x/week for 30-45 min      Cognition   Overall Cognitive Status  Within Functional Limits for tasks assessed      Posture/Postural Control   Posture/Postural Control  Postural limitations    Postural Limitations  Rounded Shoulders;Forward head      ROM / Strength   AROM / PROM / Strength  AROM;Strength      AROM   AROM Assessment Site  Shoulder;Cervical    Right/Left Shoulder  Right;Left    Right Shoulder Extension  48 Degrees    Right Shoulder Flexion  160 Degrees    Right Shoulder ABduction  172 Degrees    Right Shoulder Internal Rotation  81 Degrees    Right Shoulder External Rotation  76 Degrees    Left Shoulder Extension  47 Degrees    Left Shoulder Flexion  152 Degrees    Left Shoulder ABduction  165 Degrees    Left Shoulder Internal Rotation  70 Degrees    Left Shoulder External Rotation  85 Degrees    Cervical Flexion  WNL    Cervical Extension  WNL    Cervical - Right Side Bend  WNL    Cervical - Left Side Bend  WNL    Cervical - Right Rotation  WNL    Cervical - Left Rotation  WNL      Strength   Overall Strength  Within functional limits for tasks performed        LYMPHEDEMA/ONCOLOGY QUESTIONNAIRE - 08/25/17 2013      Type   Cancer  Type  Right breast cancer      Lymphedema Assessments   Lymphedema Assessments  Upper extremities      Right Upper Extremity Lymphedema   10 cm Proximal to Olecranon Process  29.5 cm    Olecranon Process  26.2 cm    10 cm Proximal to Ulnar Styloid Process  23.8 cm    Just Proximal to Ulnar Styloid Process  17.7 cm    Across Hand at [employer]  20.2 cm    At [employer] of 2nd Digit  7.2 cm      Left Upper Extremity Lymphedema   10 cm Proximal to Olecranon Process  30.3 cm    Olecranon Process  26.8 cm    10 cm Proximal to Ulnar Styloid Process  23.3 cm    Just Proximal to Ulnar Styloid Process  17.3 cm    Across Hand at [employer]  20.8 cm    At [place] of 2nd Digit  7.3 cm           No data recorded  Objective measurements completed on examination: See above findings.     Patient was instructed today in a home exercise program today for post op shoulder range of motion. These included active assist shoulder flexion in sitting, scapular retraction, wall walking with shoulder abduction, and hands behind head external rotation.  She was encouraged to do these twice a day, holding 3 seconds and repeating 5 times when permitted by her physician.     PT Education - 08/25/17 2014    Education provided  Yes    Education Details  Lymphedema risk reduction and post op shoulder ROM HEP    Person(s) Educated  Patient;Spouse    Methods  Explanation;Demonstration;Handout    Comprehension  Returned demonstration;Verbalized understanding           Breast Clinic Goals - 08/25/17 2019      Patient will be able to verbalize understanding of pertinent lymphedema risk reduction practices relevant to her diagnosis specifically related to skin care.   Time  1    Period  Days    Status  Achieved      Patient will be able to return demonstrate and/or verbalize understanding of the post-op home exercise program related to regaining shoulder range of motion.   Time  1    Period  Days     Status  Achieved      Patient will be able to verbalize understanding of the importance of attending the postoperative After Breast Cancer Class for further lymphedema risk reduction education and therapeutic exercise.   Time  1    Period  Days    Status  Achieved            Plan - 08/25/17 2015    Clinical Impression Statement  Patient was diagnosed on 08/11/17 with right grade 2 invasive ductal carcinoma breast cancer. It measures 2.6 cm and 6 mm in 2 areas in the upper outer quadrant. It is ER/PR positive and HER2 negative with a Ki67 of 25%. She has a history of a right mastectomy and sentinel node biopsy (0/1 nodes) and radiation in 3/06. Her multidisciplinary medical team met prior to her assessments to determine a recommended treatment plan. She is planning to have a right mastectomy and sentinel node biopsy with reconstruction followed by Oncotype testing and anti-estrogen therapy. She will benefit from post op PT to regain shoulder ROM HEP.    History and Personal Factors relevant to plan of care:  Previous breast cancer on same side    Clinical Presentation  Stable    Clinical Decision Making  Low    Rehab Potential  Excellent    Clinical Impairments Affecting Rehab Potential  None    PT Frequency  One time visit    PT Treatment/Interventions  ADLs/Self Care Home Management;Therapeutic exercise;Patient/family education    PT Next Visit Plan  Will reasses if her plastic surgeon refers her to PT    PT Home Exercise Plan  Post op shoulder ROM HEP    Consulted and Agree with Plan of Care  Patient;Family member/caregiver    Family Member Consulted  Husband       Patient will benefit from skilled therapeutic intervention in order to improve the following deficits and impairments:  Decreased range of motion, Decreased knowledge of precautions, Impaired UE functional use, Postural dysfunction, Pain  Visit Diagnosis: Malignant neoplasm of upper-outer quadrant of right breast in  female, estrogen receptor positive ([place]) - Plan: PT plan of care cert/re-cert  Abnormal posture - Plan: PT plan of care cert/re-cert   Patient will follow up at outpatient cancer rehab 3-4 weeks following surgery.  If the patient requires physical therapy at that time, a specific plan will be dictated and sent to the referring physician for approval. The patient was educated today on appropriate basic range of motion exercises to begin post operatively and the importance of attending the After Breast Cancer class following surgery.  Patient was educated today on lymphedema risk reduction practices as it pertains to recommendations that will benefit the patient immediately following surgery.  She verbalized good understanding.      Problem List Patient Active Problem List   Diagnosis Date Noted  . Malignant neoplasm of upper-outer quadrant of right breast in female, estrogen receptor positive ([place]) 08/20/2017   Annia Friendly, PT 08/25/17 8:23 PM  [place], [place], [address] Phone: [phone]   Fax:  [phone]  Name: CLYTEE HEINRICH MRN: [id number] Date of Birth: January 30, 1956

## 2017-08-25 NOTE — Patient Instructions (Signed)

## 2017-08-25 NOTE — Progress Notes (Signed)
Radiation Oncology         ([phone]) [phone] ________________________________  Name: Catherine Robbins        MRN: [id number]  Date of Service: 08/25/2017 DOB: 1955-12-30  SW:HQPRF, Hal Hope, MD  Jovita Kussmaul, MD     REFERRING PHYSICIAN: Autumn Messing III, MD   DIAGNOSIS: The encounter diagnosis was Malignant neoplasm of upper-outer quadrant of right breast in female, estrogen receptor positive ([place]).   HISTORY OF PRESENT ILLNESS: Catherine Robbins is a 62 y.o. female seen in the multidisciplinary breast clinic for a right breast cancer. The patient was noted to have a grade 1, 1.1 cm invasive ductal carcinoma with mucinous features, ER/PR positive, HER2 negative in March 2006. Her nodes and margins were negative and she completed a course of 35 fractions of radiotherapy to the right breast at that time. She has been followed in surveillance since and on her most recent mammogram had an architectural distortion in the right breast at 11:00 on 08/13/17. She had additional imaging revealing a 2.6 cm mass at 11:00, and at 12:00 there was a 4 x 6 mm lesion, and the axilla was negative for adenopathy. She had a biopsy of the 11:00 lesion on 08/17/17 that revealed a grade 2 invasive ductal carcinoma, ER/PR positive, HER 2 negative, with a Ki 67 of 25%. She comes today to discuss options of treatment for her cancer.    PREVIOUS RADIATION THERAPY: Yes   2006: received adjuvant radiotherapy to the breast in 35 fractions. Other dose details are unknown   PAST MEDICAL HISTORY:  Past Medical History:  Diagnosis Date  . 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 Lyme disease   . Von Willebrand disease ([place])        PAST SURGICAL HISTORY: Past Surgical History:  Procedure Laterality Date  . BREAST LUMPECTOMY Right   . TONSILLECTOMY       FAMILY HISTORY: No family history on file.   SOCIAL HISTORY:  reports that she has quit smoking. She has never used smokeless tobacco. She reports that she drinks alcohol. She  reports that she does not use drugs.   ALLERGIES: Patient has no known allergies.   MEDICATIONS:  Current Outpatient Medications  Medication Sig Dispense Refill  . ALPRAZolam (XANAX) 0.25 MG tablet     . anastrozole (ARIMIDEX) 1 MG tablet Take 1 tablet (1 mg total) by mouth daily. 90 tablet 4  . glucosamine-chondroitin 500-400 MG tablet Take 1 tablet by mouth 3 (three) times daily.    . Multiple Minerals-Vitamins (NUTRA-SUPPORT BONE PO) Take by mouth.    . NP THYROID 60 MG tablet     . propranolol (INDERAL) 20 MG tablet     . vitamin B-12 (CYANOCOBALAMIN) 1000 MCG tablet Take 2,000 mcg by mouth daily.    . vitamin C (ASCORBIC ACID) 500 MG tablet Take 500 mg by mouth 2 (two) times daily.     No current facility-administered medications for this encounter.      REVIEW OF SYSTEMS: On review of systems, the patient reports that she is doing well overall. She denies any chest pain, shortness of breath, cough, fevers, chills, night sweats, unintended weight changes. She denies any bowel or bladder disturbances, and denies abdominal pain, nausea or vomiting. She denies any new musculoskeletal or joint aches or pains. A complete review of systems is obtained and is otherwise negative.     PHYSICAL EXAM:  Wt Readings from Last 3 Encounters:  08/25/17 183 lb (83 kg)  Temp Readings from Last 3 Encounters:  08/25/17 99 F (37.2 C) (Oral)   BP Readings from Last 3 Encounters:  08/25/17 (!) 163/99   Pulse Readings from Last 3 Encounters:  08/25/17 (!) 59     In general this is a well appearing caucasian female in no acute distress. She is alert and oriented x4 and appropriate throughout the examination. HEENT reveals that the patient is normocephalic, atraumatic. Cardiopulmonary assessment is negative for acute distress and she exhibits normal effort. Breast exam is deferred.  ECOG = 0  0 - Asymptomatic (Fully active, able to carry on all predisease activities without  restriction)  1 - Symptomatic but completely ambulatory (Restricted in physically strenuous activity but ambulatory and able to carry out work of a light or sedentary nature. For example, light housework, office work)  2 - Symptomatic, <50% in bed during the day (Ambulatory and capable of all self care but unable to carry out any work activities. Up and about more than 50% of waking hours)  3 - Symptomatic, >50% in bed, but not bedbound (Capable of only limited self-care, confined to bed or chair 50% or more of waking hours)  4 - Bedbound (Completely disabled. Cannot carry on any self-care. Totally confined to bed or chair)  5 - Death   Eustace Pen MM, Creech RH, Tormey DC, et al. [phone]). "Toxicity and response criteria of the [facility]". [place] Oncol. 5 (6): 649-55    LABORATORY DATA:  No results found for: WBC, HGB, HCT, MCV, PLT No results found for: NA, K, CL, CO2 No results found for: ALT, AST, GGT, ALKPHOS, BILITOT    RADIOGRAPHY: No results found.     IMPRESSION/PLAN: 1. Recurrent, grade 2 ER/PR positive invasive ductal carcinoma of the right breast. Dr. Lisbeth Renshaw discusses the findings and reviews her history as well. Given that she's had prior radiotherapy to the same breast, she is not a candidate for repeat whole breast radiotherapy. As such, she would be best suited with mastectomy for local therapy followed by discussion of oncotype testing, and antiestrogen therapy. We would be happy to see her back if there becomes more information that could change the indication to consider radiotherapy.  2. Possible genetic predisposition to malignancy. The patient is a candidate for genetic testing. She was offered referral and will see genetics in the near future.   The above documentation reflects my direct findings during this shared patient visit. Please see the separate note by Dr. Lisbeth Renshaw on this date for the remainder of the patient's plan of  care.    Carola Rhine, PAC

## 2017-08-26 ENCOUNTER — Telehealth: Payer: Self-pay | Admitting: *Deleted

## 2017-08-26 NOTE — Telephone Encounter (Signed)
Spoke to pt regarding [place] from 3/37/19. Denies questions or concerns regarding dx or treatment care plan. Discussed location of plastic sx appt. Denies further needs at this time.

## 2017-08-30 ENCOUNTER — Encounter (INDEPENDENT_AMBULATORY_CARE_PROVIDER_SITE_OTHER): Payer: Self-pay

## 2017-08-30 ENCOUNTER — Inpatient Hospital Stay: Payer: BC Managed Care – PPO | Attending: Genetic Counselor | Admitting: Genetic Counselor

## 2017-08-30 ENCOUNTER — Inpatient Hospital Stay: Payer: BC Managed Care – PPO

## 2017-08-30 ENCOUNTER — Encounter: Payer: Self-pay | Admitting: Genetic Counselor

## 2017-08-30 DIAGNOSIS — Z170 Estrogen receptor positive status [ER+]: Secondary | ICD-10-CM

## 2017-08-30 DIAGNOSIS — D6800 Von Willebrand disease, unspecified: Secondary | ICD-10-CM | POA: Insufficient documentation

## 2017-08-30 DIAGNOSIS — C50411 Malignant neoplasm of upper-outer quadrant of right female breast: Secondary | ICD-10-CM

## 2017-08-30 LAB — CMP (CANCER CENTER ONLY)
ALT: 19 U/L (ref 0–55)
ANION GAP: 9 (ref 3–11)
AST: 28 U/L (ref 5–34)
Albumin: 4.4 g/dL (ref 3.5–5.0)
Alkaline Phosphatase: 57 U/L (ref 40–150)
BUN: 9 mg/dL (ref 7–26)
CO2: 27 mmol/L (ref 22–29)
CREATININE: 0.92 mg/dL (ref 0.60–1.10)
Calcium: 10.1 mg/dL (ref 8.4–10.4)
Chloride: 102 mmol/L (ref 98–109)
Glucose, Bld: 100 mg/dL (ref 70–140)
Potassium: 4.9 mmol/L (ref 3.5–5.1)
SODIUM: 138 mmol/L (ref 136–145)
TOTAL PROTEIN: 7.6 g/dL (ref 6.4–8.3)
Total Bilirubin: 0.6 mg/dL (ref 0.2–1.2)

## 2017-08-30 LAB — CBC WITH DIFFERENTIAL (CANCER CENTER ONLY)
BASOS ABS: 0 10*3/uL (ref 0.0–0.1)
BASOS PCT: 0 %
EOS ABS: 0.1 10*3/uL (ref 0.0–0.5)
Eosinophils Relative: 2 %
HEMATOCRIT: 44.4 % (ref 34.8–46.6)
Hemoglobin: 14.8 g/dL (ref 11.6–15.9)
Lymphocytes Relative: 29 %
Lymphs Abs: 1.7 10*3/uL (ref 0.9–3.3)
MCH: 29.6 pg (ref 25.1–34.0)
MCHC: 33.3 g/dL (ref 31.5–36.0)
MCV: 88.9 fL (ref 79.5–101.0)
MONOS PCT: 7 %
Monocytes Absolute: 0.4 10*3/uL (ref 0.1–0.9)
NEUTROS ABS: 3.7 10*3/uL (ref 1.5–6.5)
NEUTROS PCT: 62 %
Platelet Count: 189 10*3/uL (ref 145–400)
RBC: 4.99 MIL/uL (ref 3.70–5.45)
RDW: 13 % (ref 11.2–14.5)
WBC: 6 10*3/uL (ref 3.9–10.3)

## 2017-08-30 NOTE — Progress Notes (Signed)
[place] Psychosocial Distress Screening Spiritual Care  Met with Catherine Robbins and husband [name] in [facility] to introduce [facility] team/resources, reviewing distress screen per protocol.  The patient scored a 8 on the Psychosocial Distress Thermometer which indicates severe distress. Also assessed for distress and other psychosocial needs.   ONCBCN DISTRESS SCREENING 08/30/2017  Screening Type Initial Screening  Distress experienced in past week (1-10) 8  Emotional problem type Adjusting to illness  Information Concerns Type Lack of info about treatment  Physical Problem type Sleep/insomnia;Loss of appetitie  Referral to support programs Yes   Due to hx breast ca 2006 and her work as a Tourist information centre manager for [employer] has experience coping with stress. She cites "the unknown" related to recent dx as primary source of distress, anxiety, insomnia, and loss of appetite. Per pt, meeting [facility] team, developing tx plan, and learning about layers of support available now (compared to 2006) are all helping to reduce her distress and increase confidence.   Follow up needed: No. Per pt, no other needs at this time, but please do page if needs arise or circumstances change. Thank you.   [place], [place], [facility] Pager [phone] Voicemail [phone]

## 2017-08-30 NOTE — Progress Notes (Signed)
REFERRING PROVIDER: Chauncey Cruel, MD [address] [place], [place] [address]  PRIMARY PROVIDER:  Carol Ada, MD  PRIMARY REASON FOR VISIT:  1. Malignant neoplasm of upper-outer quadrant of right breast in female, estrogen receptor positive ([place])      HISTORY OF PRESENT ILLNESS:   Catherine Robbins, a 62 y.o. female, was seen for a [facility] cancer genetics consultation at the request of Dr. Jana Robbins due to a personal history of cancer.  Catherine Robbins presents to clinic today to discuss the possibility of a hereditary predisposition to cancer, genetic testing, and to further clarify her future cancer risks, as well as potential cancer risks for family members.   In 2006, at the age of 83, Catherine Robbins was diagnosed with breast cancer.  This was treated with a lumpectomy and radiation.  In 2019, at the age of 89, Catherine Robbins was diagnosed with breast cancer.  Both tumors are ER+/PR+/Her2-. This current treatment will be mastectomy, as it is in the same breast.  She is trying to decide between a unilateral or bilateral mastectomy.  Of note, Catherine Robbins was exposed prenatally to DES.     CANCER HISTORY:   No history exists.     HORMONAL RISK FACTORS:  Menarche was at age 59-13.  First live birth at age 74.  OCP use for approximately 10+ years.  Ovaries intact: yes.  Hysterectomy: no.  Menopausal status: postmenopausal.  HRT use: 1 years. Colonoscopy: yes; normal. Mammogram within the last year: yes. Number of breast biopsies: 3. Up to date with pelvic exams:  yes. Any excessive radiation exposure in the past:  no  Past Medical History:  Diagnosis Date  . 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 Lyme disease   . Von Willebrand disease ([place])     Past Surgical History:  Procedure Laterality Date  . BREAST LUMPECTOMY Right   . TONSILLECTOMY      Social History   Socioeconomic History  . Marital status: Married    Spouse name: Not on file  . Number of children: 2  . Years of  education: Not on file  . Highest education level: Not on file  Occupational History  . Not on file  Social Needs  . Financial resource strain: Not on file  . Food insecurity:    Worry: Not on file    Inability: Not on file  . Transportation needs:    Medical: Not on file    Non-medical: Not on file  Tobacco Use  . Smoking status: Former Research scientist (life sciences)  . Smokeless tobacco: Never Used  Substance and Sexual Activity  . Alcohol use: Yes  . Drug use: Never  . Sexual activity: Not on file  Lifestyle  . Physical activity:    Days per week: Not on file    Minutes per session: Not on file  . Stress: Not on file  Relationships  . Social connections:    Talks on phone: Not on file    Gets together: Not on file    Attends religious service: Not on file    Active member of club or organization: Not on file    Attends meetings of clubs or organizations: Not on file    Relationship status: Not on file  Other Topics Concern  . Not on file  Social History Narrative  . Not on file     FAMILY HISTORY:  We obtained a detailed, 4-generation family history.  Significant diagnoses are listed below: Family History  Problem Relation Age of Onset  . DES usage Mother   . Other Brother        DES exposure  . Heart disease Maternal Grandfather        d. 45  . Other Sister        DES exposure    The patient has two daughters who are cancer free.  She has two sisters and one brother who are cancer free.  Her brother and one sister have also been exposed to DES prenatally.  Her mother is deceased but her father is living.  The patient's mother died at 85 from heart disease.  She was an only child.  Her grandparents are deceased.  Her grandfather died at 56 from heart disease and grandmother died in her sleep at 42.  The patient's father is living at 21.  He had four brothers and a sister.  None had cancer.  The patient's grandparents are deceased from old age.  Ms. Madkins is unaware of previous  family history of genetic testing for hereditary cancer risks. Patient's maternal ancestors are of Caucasian descent, and paternal ancestors are of [place] descent. There is no reported Ashkenazi Jewish ancestry. There is no known consanguinity.  GENETIC COUNSELING ASSESSMENT: Catherine Robbins is a 62 y.o. female with a personal history of breast cancer which is somewhat suggestive of a hereditary cancer syndrome and predisposition to cancer. We, therefore, discussed and recommended the following at today's visit.   DISCUSSION: We discussed that only 5-10% of breast cancer is hereditary, with most cases due to BRCA mutations.  Other genes associated with hereditary cancer syndromes include ATM, CHEK2 and PALB2.  Based on the patient's family history, the risk for a hereditary cancer syndrome is low.  She meets criteria due to her young age of onset and multiple diagnoses.  We reviewed the characteristics, features and inheritance patterns of hereditary cancer syndromes. We also discussed genetic testing, including the appropriate family members to test, the process of testing, insurance coverage and turn-around-time for results. We discussed the implications of a negative, positive and/or variant of uncertain significant result. In order to get genetic test results in a timely manner so that Ms. Cothran can use these genetic test results for surgical decisions, we recommended Ms. Wieting pursue genetic testing for the STAT panel. If this test is negative, we then recommend Ms. Ganaway pursue reflex genetic testing to the common hereditary cancer gene panel. The Hereditary Gene Panel offered by [facility] includes sequencing and/or deletion duplication testing of the following 47 genes: APC, ATM, AXIN2, BARD1, BMPR1A, BRCA1, BRCA2, BRIP1, CDH1, CDK4, CDKN2A (p14ARF), CDKN2A (p16INK4a), CHEK2, CTNNA1, DICER1, EPCAM (Deletion/duplication testing only), GREM1 (promoter region deletion/duplication testing only), KIT, MEN1,  MLH1, MSH2, MSH3, MSH6, MUTYH, NBN, NF1, NHTL1, PALB2, PDGFRA, PMS2, POLD1, POLE, PTEN, RAD50, RAD51C, RAD51D, SDHB, SDHC, SDHD, SMAD4, SMARCA4. STK11, TP53, TSC1, TSC2, and VHL.  The following genes were evaluated for sequence changes only: SDHA and HOXB13 c.251G>A variant only.   Based on Ms. Abdo's personal history of cancer, she meets medical criteria for genetic testing. Despite that she meets criteria, she may still have an out of pocket cost. We discussed that if her out of pocket cost for testing is over $100, the laboratory will call and confirm whether she wants to proceed with testing.  If the out of pocket cost of testing is less than $100 she will be billed by the genetic testing laboratory.   PLAN: After considering the risks, benefits, and  limitations, Ms. Kahl  provided informed consent to pursue genetic testing and the blood sample was sent to [facility] for analysis of the STAT panel, reflexing to the common hereditary cancer panel. Results should be available within approximately 7-10 days time, at which the larger panel should be an additional 1-2 weeks' time, at which point they will be disclosed by telephone to Ms. Longhi, as will any additional recommendations warranted by these results. Ms. Crain will receive a summary of her genetic counseling visit and a copy of her results once available. This information will also be available in Epic. We encouraged Ms. Lamere to remain in contact with cancer genetics annually so that we can continuously update the family history and inform her of any changes in cancer genetics and testing that may be of benefit for her family. Ms. Gotcher questions were answered to her satisfaction today. Our contact information was provided should additional questions or concerns arise.  Lastly, we encouraged Ms. Depaoli to remain in contact with cancer genetics annually so that we can continuously update the family history and inform her of any changes in  cancer genetics and testing that may be of benefit for this family.   Ms.  Ritter questions were answered to her satisfaction today. Our contact information was provided should additional questions or concerns arise. Thank you for the referral and allowing [place] to share in the care of your patient.   Amron Guerrette P. Florene Glen, [place], [facility] Certified Genetic Counselor [email or web address] phone: [phone]  The patient was seen for a total of 35 minutes in face-to-face genetic counseling.  This patient was discussed with Drs. Magrinat, Lindi Adie and/or Burr Medico who agrees with the above.    _______________________________________________________________________ For Office Staff:  Number of people involved in session: 1 Was an Intern/ student involved with case: no

## 2017-09-01 LAB — VON WILLEBRAND PANEL
Coagulation Factor VIII: 80 % (ref 57–163)
RISTOCETIN CO-FACTOR, PLASMA: 78 % (ref 50–200)
Von Willebrand Antigen, Plasma: 85 % (ref 50–200)

## 2017-09-01 LAB — COAG STUDIES INTERP REPORT

## 2017-09-02 ENCOUNTER — Encounter: Payer: Self-pay | Admitting: Oncology

## 2017-09-02 ENCOUNTER — Other Ambulatory Visit: Payer: Self-pay | Admitting: Oncology

## 2017-09-06 ENCOUNTER — Other Ambulatory Visit: Payer: Self-pay | Admitting: Oncology

## 2017-09-06 NOTE — Progress Notes (Unsigned)
Phone Willebrand panel is entirely normal.  At this point I have no data that giving the patient DDAVP preop would be helpful.  She has a very variable bleeding history postop and does warrant observation after the surgery.  We will see her again once the Oncotype results are available.

## 2017-09-07 ENCOUNTER — Encounter: Payer: Self-pay | Admitting: Genetic Counselor

## 2017-09-07 DIAGNOSIS — Z1379 Encounter for other screening for genetic and chromosomal anomalies: Secondary | ICD-10-CM | POA: Insufficient documentation

## 2017-09-08 ENCOUNTER — Telehealth: Payer: Self-pay | Admitting: Genetic Counselor

## 2017-09-08 ENCOUNTER — Ambulatory Visit: Payer: Self-pay | Admitting: Genetic Counselor

## 2017-09-08 DIAGNOSIS — Z1379 Encounter for other screening for genetic and chromosomal anomalies: Secondary | ICD-10-CM

## 2017-09-08 DIAGNOSIS — Z170 Estrogen receptor positive status [ER+]: Secondary | ICD-10-CM

## 2017-09-08 DIAGNOSIS — C50411 Malignant neoplasm of upper-outer quadrant of right female breast: Secondary | ICD-10-CM

## 2017-09-08 NOTE — Telephone Encounter (Signed)
LM on VM with good news.  Asked that she CB. 

## 2017-09-08 NOTE — Telephone Encounter (Signed)
Revealed negative genetic testing.  Discussed that we do not know why she has breast cancer. It could be due to a different gene that we are not testing, or maybe our current technology may not be able to pick something up.  It will be important for her to keep in contact with genetics to keep up with whether additional testing may be needed.    

## 2017-09-08 NOTE — Progress Notes (Signed)
HPI:  Ms. Borah was [facility] due to a personal history of cancer and concerns regarding a hereditary predisposition to cancer. Please refer to our prior cancer genetics clinic note for more information regarding Ms. Kwasnik's medical, social and family histories, and our assessment and recommendations, at the time. Ms. Jernigan recent genetic test results were disclosed to her, as were recommendations warranted by these results. These results and recommendations are discussed in more detail below.  CANCER HISTORY:    Malignant neoplasm of upper-outer quadrant of right breast in female, estrogen receptor positive ([place])   08/20/2017 Initial Diagnosis    Malignant neoplasm of upper-outer quadrant of right breast in female, estrogen receptor positive ([place])      09/06/2017 Genetic Testing    Negative genetic testing on the 9 gene STAT panel and the common hereditary cancer panel.  The Hereditary Gene Panel offered by [facility] includes sequencing and/or deletion duplication testing of the following 47 genes: APC, ATM, AXIN2, BARD1, BMPR1A, BRCA1, BRCA2, BRIP1, CDH1, CDK4, CDKN2A (p14ARF), CDKN2A (p16INK4a), CHEK2, CTNNA1, DICER1, EPCAM (Deletion/duplication testing only), GREM1 (promoter region deletion/duplication testing only), KIT, MEN1, MLH1, MSH2, MSH3, MSH6, MUTYH, NBN, NF1, NHTL1, PALB2, PDGFRA, PMS2, POLD1, POLE, PTEN, RAD50, RAD51C, RAD51D, SDHB, SDHC, SDHD, SMAD4, SMARCA4. STK11, TP53, TSC1, TSC2, and VHL.  The following genes were evaluated for sequence changes only: SDHA and HOXB13 c.251G>A variant only.   The report date is September 07, 2017.        FAMILY HISTORY:  We obtained a detailed, 4-generation family history.  Significant diagnoses are listed below: Family History  Problem Relation Age of Onset  . DES usage Mother   . Other Brother        DES exposure  . Heart disease Maternal Grandfather        d. 14  . Other Sister        DES exposure      The patient has two daughters who are cancer free.  She has two sisters and one brother who are cancer free.  Her brother and one sister have also been exposed to DES prenatally.  Her mother is deceased but her father is living.  The patient's mother died at 17 from heart disease.  She was an only child.  Her grandparents are deceased.  Her grandfather died at 39 from heart disease and grandmother died in her sleep at 11.  The patient's father is living at 59.  He had four brothers and a sister.  None had cancer.  The patient's grandparents are deceased from old age.  Ms. Bocek is unaware of previous family history of genetic testing for hereditary cancer risks. Patient's maternal ancestors are of Caucasian descent, and paternal ancestors are of [place] descent. There is no reported Ashkenazi Jewish ancestry. There is no known consanguinity.  GENETIC TEST RESULTS: Genetic testing reported out on September 06, 2017 through the common hereditary cancer panel found no deleterious mutations.  The Hereditary Gene Panel offered by [facility] includes sequencing and/or deletion duplication testing of the following 47 genes: APC, ATM, AXIN2, BARD1, BMPR1A, BRCA1, BRCA2, BRIP1, CDH1, CDK4, CDKN2A (p14ARF), CDKN2A (p16INK4a), CHEK2, CTNNA1, DICER1, EPCAM (Deletion/duplication testing only), GREM1 (promoter region deletion/duplication testing only), KIT, MEN1, MLH1, MSH2, MSH3, MSH6, MUTYH, NBN, NF1, NHTL1, PALB2, PDGFRA, PMS2, POLD1, POLE, PTEN, RAD50, RAD51C, RAD51D, SDHB, SDHC, SDHD, SMAD4, SMARCA4. STK11, TP53, TSC1, TSC2, and VHL.  The following genes were evaluated for sequence changes only: SDHA and HOXB13  c.251G>A variant only.   The test report has been scanned into EPIC and is located under the Molecular Pathology section of the Results Review tab.    We discussed with Ms. Digioia that since the current genetic testing is not perfect, it is possible there may be a gene mutation in one of these genes that  current testing cannot detect, but that chance is small.  We also discussed, that it is possible that another gene that has not yet been discovered, or that we have not yet tested, is responsible for the cancer diagnoses in the family, and it is, therefore, important to remain in touch with cancer genetics in the future so that we can continue to offer Ms. Ferrucci the most up to date genetic testing.     CANCER SCREENING RECOMMENDATIONS: This result is reassuring and indicates that Ms. Rackers likely does not have an increased risk for a future cancer due to a mutation in one of these genes. This normal test also suggests that Ms. Schiraldi's cancer was most likely not due to an inherited predisposition associated with one of these genes.  Most cancers happen by chance and this negative test suggests that her cancer falls into this category.  We, therefore, recommended she continue to follow the cancer management and screening guidelines provided by her oncology and primary healthcare provider.   An individual's cancer risk and medical management are not determined by genetic test results alone. Overall cancer risk assessment incorporates additional factors, including personal medical history, family history, and any available genetic information that may result in a personalized plan for cancer prevention and surveillance.  RECOMMENDATIONS FOR FAMILY MEMBERS:  Women in this family might be at some increased risk of developing cancer, over the general population risk, simply due to the family history of cancer.  We recommended women in this family have a yearly mammogram beginning at age 77, or 59 years younger than the earliest onset of cancer, an annual clinical breast exam, and perform monthly breast self-exams. Women in this family should also have a gynecological exam as recommended by their primary provider. All family members should have a colonoscopy by age 24.  FOLLOW-UP: Lastly, we discussed with Ms.  Lasorsa that cancer genetics is a rapidly advancing field and it is possible that new genetic tests will be appropriate for her and/or her family members in the future. We encouraged her to remain in contact with cancer genetics on an annual basis so we can update her personal and family histories and let her know of advances in cancer genetics that may benefit this family.   Our contact number was provided. Ms. Tobia questions were answered to her satisfaction, and she knows she is welcome to call us at anytime with additional questions or concerns.   Roma Kayser, MS, [facility] Certified Genetic Counselor [email or web address]

## 2017-09-16 NOTE — Pre-Procedure Instructions (Signed)
[place]  09/16/2017      [place] ([place]), [place] [facility] [place] [address] [place] ([place]) [place] [address] Phone: [phone] Fax: [phone]    Your procedure is scheduled on 09/27/2017.  Report to [facility] Admitting at [place].M.  Call this number if you have problems the morning of surgery:  [phone]   Remember:  Do not eat food or drink liquids after midnight.  Take these medicines the morning of surgery with A SIP OF WATER: Anastrozole (Arimidex) NP Thyroid Propanolol (Inderal)  7 days prior to surgery STOP taking any Aspirin (unless otherwise instructed by your surgeon), Aleve, Naproxen, Ibuprofen, Motrin, Advil, Goody's, BC's, all herbal medications, fish oil, and all vitamins    Do not wear jewelry, make-up or nail polish.  Do not wear lotions, powders, or perfumes, or deodorant.  Do not shave 48 hours prior to surgery.    Do not bring valuables to the hospital.  [facility] is not responsible for any belongings or valuables.  Hearing aids, eyeglasses, contacts, dentures or bridgework may not be worn into surgery.  Leave your suitcase in the car.  After surgery it may be brought to your room.  For patients admitted to the hospital, discharge time will be determined by your treatment team.  Patients discharged the day of surgery will not be allowed to drive home.   Name and phone number of your driver:    Special instructions:   [place]- Preparing For Surgery  Before surgery, you can play an important role. Because skin is not sterile, your skin needs to be as free of germs as possible. You can reduce the number of germs on your skin by washing with CHG (chlorahexidine gluconate) Soap before surgery.  CHG is an antiseptic cleaner which kills germs and bonds with the skin to continue killing germs even after washing.  Please do not use if you have an allergy to CHG or antibacterial soaps. If your skin  becomes reddened/irritated stop using the CHG.  Do not shave (including legs and underarms) for at least 48 hours prior to first CHG shower. It is OK to shave your face.  Please follow these instructions carefully.   1. Shower the NIGHT BEFORE SURGERY and the MORNING OF SURGERY with CHG.   2. If you chose to wash your hair, wash your hair first as usual with your normal shampoo.  3. After you shampoo, rinse your hair and body thoroughly to remove the shampoo.  4. Use CHG as you would any other liquid soap. You can apply CHG directly to the skin and wash gently with a scrungie or a clean washcloth.   5. Apply the CHG Soap to your body ONLY FROM THE NECK DOWN.  Do not use on open wounds or open sores. Avoid contact with your eyes, ears, mouth and genitals (private parts). Wash Face and genitals (private parts)  with your normal soap.  6. Wash thoroughly, paying special attention to the area where your surgery will be performed.  7. Thoroughly rinse your body with warm water from the neck down.  8. DO NOT shower/wash with your normal soap after using and rinsing off the CHG Soap.  9. Pat yourself dry with a CLEAN TOWEL.  10. Wear CLEAN PAJAMAS to bed the night before surgery, wear comfortable clothes the morning of surgery  11. Place CLEAN SHEETS on your bed the night of your first shower  and DO NOT SLEEP WITH PETS.    Day of Surgery: Shower as stated above. Do not apply any deodorants/lotions. Please wear clean clothes to the hospital/surgery center.      Please read over the following fact sheets that you were given.

## 2017-09-17 ENCOUNTER — Encounter (HOSPITAL_COMMUNITY)
Admission: RE | Admit: 2017-09-17 | Discharge: 2017-09-17 | Disposition: A | Discharge status: Home / Self Care | Payer: BC Managed Care – PPO | Source: Ambulatory Visit | Attending: General Surgery | Admitting: General Surgery

## 2017-09-17 ENCOUNTER — Encounter (HOSPITAL_COMMUNITY): Payer: Self-pay

## 2017-09-17 ENCOUNTER — Other Ambulatory Visit: Payer: Self-pay

## 2017-09-17 DIAGNOSIS — C50911 Malignant neoplasm of unspecified site of right female breast: Secondary | ICD-10-CM | POA: Diagnosis not present

## 2017-09-17 DIAGNOSIS — D680 Von Willebrand's disease: Secondary | ICD-10-CM | POA: Diagnosis not present

## 2017-09-17 DIAGNOSIS — Z01812 Encounter for preprocedural laboratory examination: Secondary | ICD-10-CM | POA: Diagnosis not present

## 2017-09-17 HISTORY — DX: Malignant (primary) neoplasm, unspecified: C80.1

## 2017-09-17 HISTORY — DX: Malignant neoplasm of unspecified site of unspecified female breast: C50.919

## 2017-09-17 HISTORY — DX: Hypo-osmolality and hyponatremia: E87.1

## 2017-09-17 LAB — BASIC METABOLIC PANEL
Anion gap: 11 (ref 5–15)
BUN: 16 mg/dL (ref 6–20)
CHLORIDE: 102 mmol/L (ref 101–111)
CO2: 27 mmol/L (ref 22–32)
Calcium: 10.2 mg/dL (ref 8.9–10.3)
Creatinine, Ser: 0.91 mg/dL (ref 0.44–1.00)
GFR calc Af Amer: >60 mL/min (ref >60)
GFR calc non Af Amer: >60 mL/min (ref >60)
Glucose, Bld: 108 mg/dL — ABNORMAL HIGH (ref 65–99)
POTASSIUM: 4.6 mmol/L (ref 3.5–5.1)
SODIUM: 140 mmol/L (ref 135–145)

## 2017-09-17 LAB — CBC
HEMATOCRIT: 43.1 % (ref 36.0–46.0)
Hemoglobin: 14.3 g/dL (ref 12.0–15.0)
MCH: 29.7 pg (ref 26.0–34.0)
MCHC: 33.2 g/dL (ref 30.0–36.0)
MCV: 89.4 fL (ref 78.0–100.0)
Platelets: 189 10*3/uL (ref 150–400)
RBC: 4.82 MIL/uL (ref 3.87–5.11)
RDW: 13.7 % (ref 11.5–15.5)
WBC: 6.4 10*3/uL (ref 4.0–10.5)

## 2017-09-17 NOTE — Progress Notes (Signed)
PCP - Dr. Hyman Bower  Cardiologist - Denies  Chest x-ray - Denies  EKG - Denies  Stress Test -13 yrs ago- Negative  ECHO - 13 yrs ago-Negative  Cardiac Cath - Denies  Sleep Study - Yes- Negative CPAP - None  LABS- 09/17/17: CBC, BMP  Anesthesia- Yes- medical history.  Pt denies having chest pain, sob, or fever at this time. All instructions explained to the pt, with a verbal understanding of the material. Pt agrees to go over the instructions while at home for a better understanding. The opportunity to ask questions was provided.

## 2017-09-20 NOTE — Progress Notes (Signed)
Anesthesia Chart Review:   Case:  [id number] Date/Time:  09/27/17 0815   Procedure:  RIGHT MASTECTOMY WITH SENTINEL LYMPH NODE BIOPSY (Right Breast)   Anesthesia type:  General   Pre-op diagnosis:  RIGHT BREAST CANCER   Location:  [facility] OR ROOM 02 / [facility]   Surgeon:  Jovita Kussmaul, MD      DISCUSSION: Pt is a 62 year old female with hx of Von willebrand disease.  Per Dr. Jana Hakim, "At this point I have no data that giving the patient DDAVP preop would be helpful.  She has a very variable bleeding history postop and does warrant observation after the surgery"  VS: BP (!) 169/89   Pulse (!) 58   Temp 36.7 C (Oral)   Resp 20   Ht 5\' 10"  (1.778 m)   Wt 183 lb 6 oz (83.2 kg)   SpO2 100%   BMI 26.31 kg/m   PROVIDERS: Carol Ada, MD Patient Care Team: Jovita Kussmaul, MD as Consulting Physician (General Surgery) Magrinat, Virgie Dad, MD as Consulting Physician (Oncology) Kyung Rudd, MD as Consulting Physician (Radiation Oncology) Milly Jakob, MD as Consulting Physician (Orthopedic Surgery)   LABS: Labs reviewed: Acceptable for surgery. (all labs ordered are listed, but only abnormal results are displayed)  Labs Reviewed  BASIC METABOLIC PANEL - Abnormal; Notable for the following components:      Result Value   Glucose, Bld 108 (*)    All other components within normal limits  CBC     Past Medical History:  Diagnosis Date  . Breast cancer in female [facility])    Right  . Cancer ([place])   . 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 Low sodium levels   . Lyme disease   . Von Willebrand disease ([place])     Past Surgical History:  Procedure Laterality Date  . BREAST LUMPECTOMY Right   . TONSILLECTOMY      MEDICATIONS: . anastrozole (ARIMIDEX) 1 MG tablet  . NP THYROID 60 MG tablet  . propranolol (INDERAL) 20 MG tablet   No current facility-administered medications for this encounter.     If no changes, I anticipate pt can proceed with surgery as scheduled.   Willeen Cass,  FNP-BC [facility] Short Stay Surgical Center/Anesthesiology Phone: [phone] 09/20/2017 2:44 PM

## 2017-09-24 ENCOUNTER — Ambulatory Visit: Payer: Self-pay | Admitting: Plastic Surgery

## 2017-09-24 DIAGNOSIS — C50911 Malignant neoplasm of unspecified site of right female breast: Secondary | ICD-10-CM

## 2017-09-24 NOTE — H&P (Signed)
Catherine Robbins is an 62 y.o. female.   Chief Complaint: breast cancer HPI: The patient is a 62 y.o. yrs old wf here for pre operative history and physical prior to right breast reconstruction with TE/ADM.   She underwent a right breast lumpectomy in 2006 for an invasive ductal carcinoma (ER / PR positive, HER-2 negaive).  She had adjuvant radiation and took tamoxifen for one month.  She took the Exemestane for 5 years.  On a screening mammogram 08/11/2017 there was an abnormality in the right breast.  Ultrasound and biopsy showed new 2.6 cm irregular mass at the 11 o'clock right upper outer quadrant and right upper outer quadrant. She was found to have invasive ductal carcinoma, grade II.  ER / PR positive with Ki67 25%, HER2 not amplifies.  She is 5 feet 10 inches tall, weight is 180 pounds.  Preop bra is 38 C.  She would like to be around the same size but is ok with going smaller.  Genetics were negative.   Past Medical History:  Diagnosis Date  . Breast cancer in female [facility])    Right  . Cancer ([place])   . 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 Low sodium levels   . Lyme disease   . Von Willebrand disease ([place])     Past Surgical History:  Procedure Laterality Date  . BREAST LUMPECTOMY Right   . TONSILLECTOMY      Family History  Problem Relation Age of Onset  . DES usage Mother   . Other Brother        DES exposure  . Heart disease Maternal Grandfather        d. 70  . Other Sister        DES exposure   Social History:  reports that she has quit smoking. She has never used smokeless tobacco. She reports that she drinks alcohol. She reports that she does not use drugs.  Allergies:  Allergies  Allergen Reactions  . Tamoxifen Other (See Comments)    Severe fatigue and sweating  . Wellbutrin [Bupropion] Other (See Comments)    Unable to stand     (Not in a hospital admission)  No results found for this or any previous visit (from the past 48 hour(s)). No results found.  Review of  Systems  Constitutional: Negative.   HENT: Negative.   Eyes: Negative.   Respiratory: Negative.   Cardiovascular: Negative.   Gastrointestinal: Negative.   Genitourinary: Negative.   Musculoskeletal: Negative.   Skin: Negative.   Neurological: Negative.   Psychiatric/Behavioral: Negative.     There were no vitals taken for this visit. Physical Exam  Constitutional: She is oriented to person, place, and time. She appears well-developed and well-nourished.  HENT:  Head: Normocephalic and atraumatic.  Eyes: Pupils are equal, round, and reactive to light. EOM are normal.  Cardiovascular: Normal rate.  Respiratory: Effort normal.  GI: Soft.  Neurological: She is alert and oriented to person, place, and time.  Skin: Skin is warm.  Psychiatric: She has a normal mood and affect. Her behavior is normal. Judgment and thought content normal.     Assessment/Plan Plan for right breast reconstruction with placement of TE/ADM.  The risks that can be encountered with and after placement of a breast expander placement were discussed and include the following but not limited to these: bleeding, infection, delayed healing, anesthesia risks, skin sensation changes, injury to structures including nerves, blood vessels, and muscles which may be temporary or  permanent, allergies to tape, suture materials and glues, blood products, topical preparations or injected agents, skin contour irregularities, skin discoloration and swelling, deep vein thrombosis, cardiac and pulmonary complications, pain, which may persist, fluid accumulation, wrinkling of the skin over the expander, changes in nipple or breast sensation, expander leakage or rupture, faulty position of the expander, persistent pain, formation of tight scar tissue around the expander (capsular contracture), possible need for revisional surgery or staged procedures. Prescriptions for Norco, Valium, Keflex this visit.  Planning overnight stay.  The  patient's and husband's questions were answered and she desires to proceed and consent is obtained  Wallace Going, DO 09/24/2017, 12:44 PM

## 2017-09-27 ENCOUNTER — Ambulatory Visit (HOSPITAL_COMMUNITY): Payer: BC Managed Care – PPO | Admitting: Vascular Surgery

## 2017-09-27 ENCOUNTER — Ambulatory Visit (HOSPITAL_COMMUNITY): Payer: BC Managed Care – PPO | Admitting: Certified Registered Nurse Anesthetist

## 2017-09-27 ENCOUNTER — Ambulatory Visit (HOSPITAL_COMMUNITY)
Admission: RE | Admit: 2017-09-27 | Discharge: 2017-09-27 | Disposition: A | Discharge status: Home / Self Care | Payer: BC Managed Care – PPO | Source: Ambulatory Visit | Attending: General Surgery | Admitting: General Surgery

## 2017-09-27 ENCOUNTER — Ambulatory Visit (HOSPITAL_COMMUNITY)
Admission: RE | Admit: 2017-09-27 | Discharge: 2017-09-28 | Disposition: A | Discharge status: Home / Self Care | Payer: BC Managed Care – PPO | Source: Ambulatory Visit | Attending: Plastic Surgery | Admitting: Plastic Surgery

## 2017-09-27 ENCOUNTER — Encounter (HOSPITAL_COMMUNITY)
Admission: RE | Disposition: A | Discharge status: Home / Self Care | Payer: Self-pay | Source: Ambulatory Visit | Attending: Plastic Surgery

## 2017-09-27 ENCOUNTER — Encounter (HOSPITAL_COMMUNITY): Payer: Self-pay | Admitting: Certified Registered Nurse Anesthetist

## 2017-09-27 DIAGNOSIS — D680 Von Willebrand's disease: Secondary | ICD-10-CM | POA: Insufficient documentation

## 2017-09-27 DIAGNOSIS — A6920 Lyme disease, unspecified: Secondary | ICD-10-CM | POA: Diagnosis not present

## 2017-09-27 DIAGNOSIS — D0511 Intraductal carcinoma in situ of right breast: Secondary | ICD-10-CM | POA: Diagnosis not present

## 2017-09-27 DIAGNOSIS — Z79890 Hormone replacement therapy (postmenopausal): Secondary | ICD-10-CM | POA: Diagnosis not present

## 2017-09-27 DIAGNOSIS — M1990 Unspecified osteoarthritis, unspecified site: Secondary | ICD-10-CM | POA: Diagnosis not present

## 2017-09-27 DIAGNOSIS — Z170 Estrogen receptor positive status [ER+]: Secondary | ICD-10-CM | POA: Diagnosis not present

## 2017-09-27 DIAGNOSIS — Z853 Personal history of malignant neoplasm of breast: Secondary | ICD-10-CM | POA: Insufficient documentation

## 2017-09-27 DIAGNOSIS — Z923 Personal history of irradiation: Secondary | ICD-10-CM | POA: Insufficient documentation

## 2017-09-27 DIAGNOSIS — Z87891 Personal history of nicotine dependence: Secondary | ICD-10-CM | POA: Insufficient documentation

## 2017-09-27 DIAGNOSIS — C50111 Malignant neoplasm of central portion of right female breast: Secondary | ICD-10-CM

## 2017-09-27 DIAGNOSIS — C50411 Malignant neoplasm of upper-outer quadrant of right female breast: Secondary | ICD-10-CM | POA: Diagnosis present

## 2017-09-27 DIAGNOSIS — Z79899 Other long term (current) drug therapy: Secondary | ICD-10-CM | POA: Insufficient documentation

## 2017-09-27 DIAGNOSIS — E079 Disorder of thyroid, unspecified: Secondary | ICD-10-CM | POA: Diagnosis not present

## 2017-09-27 DIAGNOSIS — Z8249 Family history of ischemic heart disease and other diseases of the circulatory system: Secondary | ICD-10-CM | POA: Insufficient documentation

## 2017-09-27 DIAGNOSIS — Z79811 Long term (current) use of aromatase inhibitors: Secondary | ICD-10-CM | POA: Diagnosis not present

## 2017-09-27 DIAGNOSIS — C50919 Malignant neoplasm of unspecified site of unspecified female breast: Secondary | ICD-10-CM | POA: Diagnosis present

## 2017-09-27 DIAGNOSIS — I10 Essential (primary) hypertension: Secondary | ICD-10-CM | POA: Diagnosis not present

## 2017-09-27 DIAGNOSIS — C50911 Malignant neoplasm of unspecified site of right female breast: Secondary | ICD-10-CM

## 2017-09-27 HISTORY — PX: BREAST RECONSTRUCTION WITH PLACEMENT OF TISSUE EXPANDER AND FLEX HD (ACELLULAR HYDRATED DERMIS): SHX6295

## 2017-09-27 HISTORY — PX: MASTECTOMY W/ SENTINEL NODE BIOPSY: SHX2001

## 2017-09-27 SURGERY — MASTECTOMY WITH SENTINEL LYMPH NODE BIOPSY
Anesthesia: General | Site: Breast | Laterality: Right

## 2017-09-27 MED ORDER — HYDROMORPHONE HCL 2 MG/ML IJ SOLN
INTRAMUSCULAR | Status: AC
  Filled 2017-09-27: qty 1

## 2017-09-27 MED ORDER — HEPARIN SODIUM (PORCINE) 5000 UNIT/ML IJ SOLN
5000.0000 [IU] | Freq: Three times a day (TID) | INTRAMUSCULAR | Status: DC
  Administered 2017-09-28: 5000 [IU] via SUBCUTANEOUS
  Filled 2017-09-27: qty 1

## 2017-09-27 MED ORDER — SUGAMMADEX SODIUM 200 MG/2ML IV SOLN
INTRAVENOUS | Status: AC
Start: 2017-09-27 — End: 2017-09-27
  Filled 2017-09-27: qty 2

## 2017-09-27 MED ORDER — LACTATED RINGERS IV SOLN
INTRAVENOUS | Status: DC | PRN
  Administered 2017-09-27 (×2): via INTRAVENOUS

## 2017-09-27 MED ORDER — ONDANSETRON HCL 4 MG/2ML IJ SOLN
INTRAMUSCULAR | Status: DC | PRN
  Administered 2017-09-27: 4 mg via INTRAVENOUS

## 2017-09-27 MED ORDER — ACETAMINOPHEN 325 MG PO TABS
325.0000 mg | ORAL_TABLET | Freq: Four times a day (QID) | ORAL | Status: DC
  Administered 2017-09-27 (×2): 325 mg via ORAL
  Filled 2017-09-27 (×4): qty 1

## 2017-09-27 MED ORDER — DIPHENHYDRAMINE HCL 12.5 MG/5ML PO ELIX: 12.5000 mg | ORAL_SOLUTION | Freq: Four times a day (QID) | ORAL | Status: DC | PRN

## 2017-09-27 MED ORDER — CELECOXIB 200 MG PO CAPS
200.0000 mg | ORAL_CAPSULE | ORAL | Status: AC
  Administered 2017-09-27: 200 mg via ORAL
  Filled 2017-09-27: qty 1

## 2017-09-27 MED ORDER — EPHEDRINE 5 MG/ML INJ
INTRAVENOUS | Status: AC
  Filled 2017-09-27: qty 10

## 2017-09-27 MED ORDER — PROPRANOLOL HCL 10 MG PO TABS
20.0000 mg | ORAL_TABLET | Freq: Every day | ORAL | Status: DC
  Administered 2017-09-28: 20 mg via ORAL
  Filled 2017-09-27: qty 2

## 2017-09-27 MED ORDER — DEXAMETHASONE SODIUM PHOSPHATE 10 MG/ML IJ SOLN
INTRAMUSCULAR | Status: DC | PRN
  Administered 2017-09-27: 10 mg via INTRAVENOUS

## 2017-09-27 MED ORDER — METHOCARBAMOL 500 MG PO TABS
500.0000 mg | ORAL_TABLET | Freq: Four times a day (QID) | ORAL | Status: DC | PRN
  Administered 2017-09-27 – 2017-09-28 (×3): 500 mg via ORAL
  Filled 2017-09-27 (×3): qty 1

## 2017-09-27 MED ORDER — SUGAMMADEX SODIUM 200 MG/2ML IV SOLN
INTRAVENOUS | Status: DC | PRN
  Administered 2017-09-27: 200 mg via INTRAVENOUS

## 2017-09-27 MED ORDER — EPHEDRINE SULFATE-NACL 50-0.9 MG/10ML-% IV SOSY
PREFILLED_SYRINGE | INTRAVENOUS | Status: DC | PRN
  Administered 2017-09-27 (×3): 10 mg via INTRAVENOUS
  Administered 2017-09-27: 5 mg via INTRAVENOUS
  Administered 2017-09-27: 10 mg via INTRAVENOUS
  Administered 2017-09-27: 5 mg via INTRAVENOUS

## 2017-09-27 MED ORDER — MORPHINE SULFATE (PF) 4 MG/ML IV SOLN: 1.0000 mg | INTRAVENOUS | Status: DC | PRN

## 2017-09-27 MED ORDER — SENNA 8.6 MG PO TABS
1.0000 | ORAL_TABLET | Freq: Two times a day (BID) | ORAL | Status: DC
  Administered 2017-09-27 (×2): 8.6 mg via ORAL
  Filled 2017-09-27 (×3): qty 1

## 2017-09-27 MED ORDER — DIAZEPAM 2 MG PO TABS
2.0000 mg | ORAL_TABLET | Freq: Two times a day (BID) | ORAL | Status: DC | PRN
  Filled 2017-09-27: qty 1

## 2017-09-27 MED ORDER — CEFAZOLIN SODIUM-DEXTROSE 2-4 GM/100ML-% IV SOLN
2.0000 g | INTRAVENOUS | Status: AC
  Administered 2017-09-27: 2 g via INTRAVENOUS
  Filled 2017-09-27: qty 100

## 2017-09-27 MED ORDER — HYDROMORPHONE HCL 2 MG/ML IJ SOLN: 0.2500 mg | INTRAMUSCULAR | Status: DC | PRN

## 2017-09-27 MED ORDER — ROCURONIUM BROMIDE 10 MG/ML (PF) SYRINGE
PREFILLED_SYRINGE | INTRAVENOUS | Status: DC | PRN
  Administered 2017-09-27 (×2): 20 mg via INTRAVENOUS
  Administered 2017-09-27: 30 mg via INTRAVENOUS
  Administered 2017-09-27: 20 mg via INTRAVENOUS
  Administered 2017-09-27: 50 mg via INTRAVENOUS
  Administered 2017-09-27: 20 mg via INTRAVENOUS

## 2017-09-27 MED ORDER — ONDANSETRON HCL 4 MG/2ML IJ SOLN
INTRAMUSCULAR | Status: AC
Start: 2017-09-27 — End: 2017-09-27
  Filled 2017-09-27: qty 2

## 2017-09-27 MED ORDER — GABAPENTIN 300 MG PO CAPS
300.0000 mg | ORAL_CAPSULE | ORAL | Status: AC
  Administered 2017-09-27: 300 mg via ORAL
  Filled 2017-09-27: qty 1

## 2017-09-27 MED ORDER — DIPHENHYDRAMINE HCL 50 MG/ML IJ SOLN: 12.5000 mg | Freq: Four times a day (QID) | INTRAMUSCULAR | Status: DC | PRN

## 2017-09-27 MED ORDER — ONDANSETRON 4 MG PO TBDP: 4.0000 mg | ORAL_TABLET | Freq: Four times a day (QID) | ORAL | Status: DC | PRN

## 2017-09-27 MED ORDER — KCL IN DEXTROSE-NACL 20-5-0.9 MEQ/L-%-% IV SOLN
INTRAVENOUS | Status: DC
  Filled 2017-09-27: qty 1000

## 2017-09-27 MED ORDER — FENTANYL CITRATE (PF) 100 MCG/2ML IJ SOLN
50.0000 ug | Freq: Once | INTRAMUSCULAR | Status: AC
  Administered 2017-09-27: 50 ug via INTRAVENOUS

## 2017-09-27 MED ORDER — MIDAZOLAM HCL 2 MG/2ML IJ SOLN
INTRAMUSCULAR | Status: AC
  Filled 2017-09-27: qty 2

## 2017-09-27 MED ORDER — POLYETHYLENE GLYCOL 3350 17 G PO PACK: 17.0000 g | PACK | Freq: Every day | ORAL | Status: DC | PRN

## 2017-09-27 MED ORDER — 0.9 % SODIUM CHLORIDE (POUR BTL) OPTIME
TOPICAL | Status: DC | PRN
  Administered 2017-09-27 (×2): 1000 mL

## 2017-09-27 MED ORDER — METHYLENE BLUE 0.5 % INJ SOLN
INTRAVENOUS | Status: AC
  Filled 2017-09-27: qty 10

## 2017-09-27 MED ORDER — ONDANSETRON HCL 4 MG/2ML IJ SOLN: 4.0000 mg | Freq: Four times a day (QID) | INTRAMUSCULAR | Status: DC | PRN

## 2017-09-27 MED ORDER — FENTANYL CITRATE (PF) 250 MCG/5ML IJ SOLN
INTRAMUSCULAR | Status: AC
Start: 2017-09-27 — End: 2017-09-27
  Filled 2017-09-27: qty 5

## 2017-09-27 MED ORDER — HYDROCODONE-ACETAMINOPHEN 5-325 MG PO TABS
1.0000 | ORAL_TABLET | ORAL | Status: DC | PRN
  Administered 2017-09-27 – 2017-09-28 (×3): 2 via ORAL
  Filled 2017-09-27 (×3): qty 2

## 2017-09-27 MED ORDER — THYROID 60 MG PO TABS
60.0000 mg | ORAL_TABLET | Freq: Every day | ORAL | Status: DC
  Administered 2017-09-28: 60 mg via ORAL
  Filled 2017-09-27: qty 1

## 2017-09-27 MED ORDER — HYDROCODONE-ACETAMINOPHEN 5-325 MG PO TABS
1.0000 | ORAL_TABLET | ORAL | Status: DC | PRN
  Administered 2017-09-27: 2 via ORAL

## 2017-09-27 MED ORDER — MIDAZOLAM HCL 2 MG/2ML IJ SOLN
2.0000 mg | Freq: Once | INTRAMUSCULAR | Status: AC
Start: 2017-09-27 — End: 2017-09-27
  Administered 2017-09-27: 2 mg via INTRAVENOUS

## 2017-09-27 MED ORDER — ONDANSETRON HCL 4 MG/2ML IJ SOLN: 4.0000 mg | Freq: Once | INTRAMUSCULAR | Status: DC | PRN

## 2017-09-27 MED ORDER — HYDROCODONE-ACETAMINOPHEN 5-325 MG PO TABS
ORAL_TABLET | ORAL | Status: AC
  Filled 2017-09-27: qty 2

## 2017-09-27 MED ORDER — ACETAMINOPHEN 500 MG PO TABS
1000.0000 mg | ORAL_TABLET | ORAL | Status: AC
  Administered 2017-09-27: 1000 mg via ORAL
  Filled 2017-09-27: qty 2

## 2017-09-27 MED ORDER — FAMOTIDINE IN NACL 20-0.9 MG/50ML-% IV SOLN
20.0000 mg | Freq: Two times a day (BID) | INTRAVENOUS | Status: DC
  Administered 2017-09-27 (×2): 20 mg via INTRAVENOUS
  Filled 2017-09-27 (×3): qty 50

## 2017-09-27 MED ORDER — CEFAZOLIN SODIUM-DEXTROSE 2-4 GM/100ML-% IV SOLN: 2.0000 g | INTRAVENOUS | Status: DC

## 2017-09-27 MED ORDER — POLYMYXIN B SULFATE 500000 UNITS IJ SOLR
INTRAMUSCULAR | Status: DC | PRN
  Administered 2017-09-27: 500 mL

## 2017-09-27 MED ORDER — CHLORHEXIDINE GLUCONATE CLOTH 2 % EX PADS: 6.0000 | MEDICATED_PAD | Freq: Once | CUTANEOUS | Status: DC

## 2017-09-27 MED ORDER — FENTANYL CITRATE (PF) 100 MCG/2ML IJ SOLN
INTRAMUSCULAR | Status: AC
  Administered 2017-09-27: 50 ug via INTRAVENOUS
  Filled 2017-09-27: qty 2

## 2017-09-27 MED ORDER — CEFAZOLIN SODIUM-DEXTROSE 2-4 GM/100ML-% IV SOLN
2.0000 g | Freq: Three times a day (TID) | INTRAVENOUS | Status: DC
  Administered 2017-09-27 – 2017-09-28 (×3): 2 g via INTRAVENOUS
  Filled 2017-09-27 (×4): qty 100

## 2017-09-27 MED ORDER — ANASTROZOLE 1 MG PO TABS
1.0000 mg | ORAL_TABLET | Freq: Every day | ORAL | Status: DC
  Administered 2017-09-27: 1 mg via ORAL
  Filled 2017-09-27 (×2): qty 1

## 2017-09-27 MED ORDER — POTASSIUM CHLORIDE IN NACL 20-0.45 MEQ/L-% IV SOLN
INTRAVENOUS | Status: DC
  Administered 2017-09-27 – 2017-09-28 (×2): via INTRAVENOUS
  Filled 2017-09-27 (×2): qty 1000

## 2017-09-27 MED ORDER — HYDROMORPHONE HCL 1 MG/ML IJ SOLN
1.0000 mg | INTRAMUSCULAR | Status: DC | PRN
  Administered 2017-09-27: 1 mg via INTRAVENOUS

## 2017-09-27 MED ORDER — FENTANYL CITRATE (PF) 250 MCG/5ML IJ SOLN
INTRAMUSCULAR | Status: DC | PRN
  Administered 2017-09-27: 150 ug via INTRAVENOUS
  Administered 2017-09-27 (×2): 50 ug via INTRAVENOUS

## 2017-09-27 MED ORDER — TECHNETIUM TC 99M SULFUR COLLOID FILTERED
1.0000 | Freq: Once | INTRAVENOUS | Status: AC | PRN
  Administered 2017-09-27: 1 via INTRADERMAL

## 2017-09-27 MED ORDER — NAPROXEN 250 MG PO TABS: 500.0000 mg | ORAL_TABLET | Freq: Two times a day (BID) | ORAL | Status: DC | PRN

## 2017-09-27 MED ORDER — LIDOCAINE 2% (20 MG/ML) 5 ML SYRINGE
INTRAMUSCULAR | Status: AC
  Filled 2017-09-27: qty 5

## 2017-09-27 MED ORDER — LACTATED RINGERS IV SOLN
Freq: Once | INTRAVENOUS | Status: AC
  Administered 2017-09-27: 07:00:00 via INTRAVENOUS

## 2017-09-27 MED ORDER — PROPOFOL 10 MG/ML IV BOLUS
INTRAVENOUS | Status: AC
  Filled 2017-09-27: qty 20

## 2017-09-27 MED ORDER — ROCURONIUM BROMIDE 10 MG/ML (PF) SYRINGE
PREFILLED_SYRINGE | INTRAVENOUS | Status: AC
  Filled 2017-09-27: qty 5

## 2017-09-27 MED ORDER — SODIUM CHLORIDE 0.9 % IV SOLN
INTRAVENOUS | Status: AC
  Filled 2017-09-27: qty 500000

## 2017-09-27 MED ORDER — PROPOFOL 10 MG/ML IV BOLUS
INTRAVENOUS | Status: DC | PRN
  Administered 2017-09-27: 100 mg via INTRAVENOUS

## 2017-09-27 MED ORDER — HYDROMORPHONE HCL 2 MG/ML IJ SOLN: 1.0000 mg | INTRAMUSCULAR | Status: DC | PRN

## 2017-09-27 MED ORDER — MEPERIDINE HCL 50 MG/ML IJ SOLN: 6.2500 mg | INTRAMUSCULAR | Status: DC | PRN

## 2017-09-27 MED ORDER — BUPIVACAINE-EPINEPHRINE (PF) 0.5% -1:200000 IJ SOLN
INTRAMUSCULAR | Status: DC | PRN
  Administered 2017-09-27: 30 mL
  Administered 2017-09-27: 30 mL via PERINEURAL

## 2017-09-27 MED ORDER — LIDOCAINE 2% (20 MG/ML) 5 ML SYRINGE
INTRAMUSCULAR | Status: DC | PRN
  Administered 2017-09-27: 100 mg via INTRAVENOUS

## 2017-09-27 MED ORDER — MIDAZOLAM HCL 2 MG/2ML IJ SOLN
INTRAMUSCULAR | Status: AC
  Administered 2017-09-27: 2 mg via INTRAVENOUS
  Filled 2017-09-27: qty 2

## 2017-09-27 SURGICAL SUPPLY — 78 items
ADH SKN CLS APL DERMABOND .7 (GAUZE/BANDAGES/DRESSINGS) ×2
APPLIER CLIP 9.375 MED OPEN (MISCELLANEOUS) ×4
APR CLP MED 9.3 20 MLT OPN (MISCELLANEOUS) ×2
BAG DECANTER FOR FLEXI CONT (MISCELLANEOUS) ×4 IMPLANT
BINDER BREAST LRG (GAUZE/BANDAGES/DRESSINGS) IMPLANT
BINDER BREAST XLRG (GAUZE/BANDAGES/DRESSINGS) ×2 IMPLANT
BIOPATCH RED 1 DISK 7.0 (GAUZE/BANDAGES/DRESSINGS) ×7 IMPLANT
BIOPATCH RED 1IN DISK 7.0MM (GAUZE/BANDAGES/DRESSINGS) ×1
CANISTER SUCT 3000ML PPV (MISCELLANEOUS) ×8 IMPLANT
CHLORAPREP W/TINT 26ML (MISCELLANEOUS) ×6 IMPLANT
CLIP APPLIE 9.375 MED OPEN (MISCELLANEOUS) ×2 IMPLANT
CONT SPEC 4OZ CLIKSEAL STRL BL (MISCELLANEOUS) ×4 IMPLANT
COVER PROBE W GEL 5X96 (DRAPES) ×4 IMPLANT
COVER SURGICAL LIGHT HANDLE (MISCELLANEOUS) ×6 IMPLANT
DERMABOND ADVANCED (GAUZE/BANDAGES/DRESSINGS) ×2
DERMABOND ADVANCED .7 DNX12 (GAUZE/BANDAGES/DRESSINGS) ×4 IMPLANT
DEVICE DISSECT PLASMABLAD 3.0S (MISCELLANEOUS) IMPLANT
DRAIN CHANNEL 19F RND (DRAIN) ×6 IMPLANT
DRAPE CHEST BREAST 15X10 FENES (DRAPES) ×2 IMPLANT
DRAPE HALF SHEET 40X57 (DRAPES) ×2 IMPLANT
DRAPE ORTHO SPLIT 77X108 STRL (DRAPES) ×8
DRAPE SURG 17X23 STRL (DRAPES) ×12 IMPLANT
DRAPE SURG ORHT 6 SPLT 77X108 (DRAPES) ×4 IMPLANT
DRAPE WARM FLUID 44X44 (DRAPE) ×4 IMPLANT
DRSG PAD ABDOMINAL 8X10 ST (GAUZE/BANDAGES/DRESSINGS) ×4 IMPLANT
DRSG TEGADERM 4X4.75 (GAUZE/BANDAGES/DRESSINGS) ×4 IMPLANT
ELECT BLADE 4.0 EZ CLEAN MEGAD (MISCELLANEOUS) ×4
ELECT CAUTERY BLADE 6.4 (BLADE) ×4 IMPLANT
ELECT REM PT RETURN 9FT ADLT (ELECTROSURGICAL) ×8
ELECTRODE BLDE 4.0 EZ CLN MEGD (MISCELLANEOUS) IMPLANT
ELECTRODE REM PT RTRN 9FT ADLT (ELECTROSURGICAL) ×4 IMPLANT
EVACUATOR SILICONE 100CC (DRAIN) ×6 IMPLANT
GAUZE SPONGE 4X4 12PLY STRL (GAUZE/BANDAGES/DRESSINGS) ×4 IMPLANT
GLOVE BIO SURGEON STRL SZ 6.5 (GLOVE) ×6 IMPLANT
GLOVE BIO SURGEON STRL SZ7.5 (GLOVE) ×4 IMPLANT
GLOVE BIO SURGEONS STRL SZ 6.5 (GLOVE) ×2
GOWN STRL REUS W/ TWL LRG LVL3 (GOWN DISPOSABLE) ×8 IMPLANT
GOWN STRL REUS W/TWL LRG LVL3 (GOWN DISPOSABLE) ×20
GRAFT FLEX HD 4X16 THICK (Tissue Mesh) ×2 IMPLANT
IMPL EXPANDER BREAST 535CC (Breast) IMPLANT
IMPLANT BREAST 535CC (Breast) ×2 IMPLANT
IMPLANT EXPANDER BREAST 535CC (Breast) ×2 IMPLANT
KIT BASIN OR (CUSTOM PROCEDURE TRAY) ×8 IMPLANT
KIT TURNOVER KIT B (KITS) ×8 IMPLANT
LIGHT WAVEGUIDE WIDE FLAT (MISCELLANEOUS) IMPLANT
NDL 18GX1X1/2 (RX/OR ONLY) (NEEDLE) IMPLANT
NDL FILTER BLUNT 18X1 1/2 (NEEDLE) IMPLANT
NDL HYPO 25GX1X1/2 BEV (NEEDLE) IMPLANT
NEEDLE 18GX1X1/2 (RX/OR ONLY) (NEEDLE) IMPLANT
NEEDLE FILTER BLUNT 18X 1/2SAF (NEEDLE)
NEEDLE FILTER BLUNT 18X1 1/2 (NEEDLE) IMPLANT
NEEDLE HYPO 25GX1X1/2 BEV (NEEDLE) ×4 IMPLANT
NS IRRIG 1000ML POUR BTL (IV SOLUTION) ×12 IMPLANT
PACK GENERAL/GYN (CUSTOM PROCEDURE TRAY) ×6 IMPLANT
PACK SURGICAL SETUP 50X90 (CUSTOM PROCEDURE TRAY) ×2 IMPLANT
PAD ABD 8X10 STRL (GAUZE/BANDAGES/DRESSINGS) ×4 IMPLANT
PAD ARMBOARD 7.5X6 YLW CONV (MISCELLANEOUS) ×6 IMPLANT
PIN SAFETY STERILE (MISCELLANEOUS) ×2 IMPLANT
PLASMABLADE 3.0S (MISCELLANEOUS) ×4
SET ASEPTIC TRANSFER (MISCELLANEOUS) ×2 IMPLANT
SPECIMEN JAR X LARGE (MISCELLANEOUS) ×4 IMPLANT
SUT ETHILON 3 0 FSL (SUTURE) ×4 IMPLANT
SUT MNCRL AB 3-0 PS2 18 (SUTURE) ×2 IMPLANT
SUT MNCRL AB 4-0 PS2 18 (SUTURE) ×10 IMPLANT
SUT MON AB 3-0 SH 27 (SUTURE) ×8
SUT MON AB 3-0 SH27 (SUTURE) ×4 IMPLANT
SUT MON AB 5-0 PS2 18 (SUTURE) ×8 IMPLANT
SUT PDS AB 2-0 CT1 27 (SUTURE) ×14 IMPLANT
SUT SILK 4 0 PS 2 (SUTURE) ×4 IMPLANT
SUT VIC AB 3-0 54X BRD REEL (SUTURE) IMPLANT
SUT VIC AB 3-0 BRD 54 (SUTURE)
SUT VIC AB 3-0 SH 18 (SUTURE) ×2 IMPLANT
SYR CONTROL 10ML LL (SYRINGE) IMPLANT
TOWEL OR 17X24 6PK STRL BLUE (TOWEL DISPOSABLE) ×8 IMPLANT
TOWEL OR 17X26 10 PK STRL BLUE (TOWEL DISPOSABLE) ×8 IMPLANT
TRAY FOLEY MTR SLVR 14FR STAT (SET/KITS/TRAYS/PACK) ×2 IMPLANT
TUBE CONNECTING 12'X1/4 (SUCTIONS) ×1
TUBE CONNECTING 12X1/4 (SUCTIONS) ×1 IMPLANT

## 2017-09-27 NOTE — Anesthesia Procedure Notes (Signed)
Procedure Name: Intubation Date/Time: 09/27/2017 8:45 AM Performed by: Harden Mo, CRNA Pre-anesthesia Checklist: Patient identified, Emergency Drugs available, Suction available and Patient being monitored Patient Re-evaluated:Patient Re-evaluated prior to induction Oxygen Delivery Method: Circle System Utilized Preoxygenation: Pre-oxygenation with 100% oxygen Induction Type: IV induction Ventilation: Mask ventilation without difficulty Laryngoscope Size: Miller and 2 Grade View: Grade I Tube type: Oral Tube size: 7.5 mm Number of attempts: 1 Airway Equipment and Method: Stylet and Oral airway Placement Confirmation: ETT inserted through vocal cords under direct vision,  positive ETCO2 and breath sounds checked- equal and bilateral Secured at: 22 cm Tube secured with: Tape Dental Injury: Teeth and Oropharynx as per pre-operative assessment

## 2017-09-27 NOTE — Discharge Instructions (Signed)
No heavy lifting. Continue binder or sports bra. Sink bath or baby wipes.

## 2017-09-27 NOTE — Anesthesia Preprocedure Evaluation (Addendum)
Anesthesia Evaluation  Patient identified by MRN, date of birth, ID band Patient awake    Reviewed: Allergy & Precautions, NPO status , Patient's Chart, lab work & pertinent test results  Airway Mallampati: II  TM Distance: >3 FB Neck ROM: Full    Dental  (+) Teeth Intact, Dental Advisory Given   Pulmonary former smoker,    Pulmonary exam normal        Cardiovascular Normal cardiovascular exam     Neuro/Psych    GI/Hepatic   Endo/Other    Renal/GU      Musculoskeletal   Abdominal   Peds  Hematology   Anesthesia Other Findings   Reproductive/Obstetrics                            Anesthesia Physical Anesthesia Plan  ASA: II  Anesthesia Plan: General   Post-op Pain Management: GA combined w/ Regional for post-op pain   Induction: Intravenous  PONV Risk Score and Plan:   Airway Management Planned: Oral ETT  Additional Equipment:   Intra-op Plan:   Post-operative Plan: Extubation in OR  Informed Consent: I have reviewed the patients History and Physical, chart, labs and discussed the procedure including the risks, benefits and alternatives for the proposed anesthesia with the patient or authorized representative who has indicated his/her understanding and acceptance.   Dental advisory given  Plan Discussed with: CRNA and Surgeon  Anesthesia Plan Comments:        Anesthesia Quick Evaluation

## 2017-09-27 NOTE — Progress Notes (Signed)
1335 Received pt from PACU. Patient alert, oriented x4, sleepy. Dressings clean, dry and intact. Breast binder on. Patient has no complaints of pain at this time. Will continue to monitor.

## 2017-09-27 NOTE — Anesthesia Procedure Notes (Signed)
Anesthesia Regional Block: Pectoralis block   Pre-Anesthetic Checklist: ,, timeout performed, Correct Patient, Correct Site, Correct Laterality, Correct Procedure, Correct Position, site marked, Risks and benefits discussed,  Surgical consent,  Pre-op evaluation,  At surgeon's request and post-op pain management  Laterality: Right  Prep: chloraprep       Needles:  Injection technique: Single-shot     Needle Length: 9cm  Needle Gauge: 21     Additional Needles:   Narrative:  Start time: 09/27/2017 7:47 AM End time: 09/27/2017 7:57 AM Injection made incrementally with aspirations every 5 mL.  Performed by: Personally  Anesthesiologist: Lillia Abed, MD  Additional Notes: Monitors applied. Patient sedated. Sterile prep and drape,hand hygiene and sterile gloves were used. Relevant anatomy identified.Needle position confirmed.Local anesthetic injected incrementally after negative aspiration. Local anesthetic spread visualized. Vascular puncture avoided. No complications. Image printed for medical record.The patient tolerated the procedure well.

## 2017-09-27 NOTE — Op Note (Addendum)
First Assist Op Note: [facility] OR I assisted the Surgeon(s) _____Dr. Lyndee Leo Dillingham__ on the procedure(s): __Immediate right breast reconstruction with placement of tissue expander and dermal matrix___on Date __4/29/19_______  I provided my assistance on this case as follows:  I was present and acted as first Environmental consultant during this operation. I was involved in the prepping and placement of sterile drapes. A time out was performed and all information confirmed to be correct.  I first assisted during the case including retraction for exposure, assisting with closure of surgical wounds and application of sterile dressings. I provided assistance with application of post operative garments/splinting and assisted with patient transfer back to the stretcher as needed.   Verlean Allport,PA-C Plastic Surgery [phone]

## 2017-09-27 NOTE — Interval H&P Note (Signed)
History and Physical Interval Note:  09/27/2017 8:25 AM  Catherine Robbins  has presented today for surgery, with the diagnosis of RIGHT BREAST CANCER  The various methods of treatment have been discussed with the patient and family. After consideration of risks, benefits and other options for treatment, the patient has consented to  Procedure(s): RIGHT MASTECTOMY WITH SENTINEL LYMPH NODE BIOPSY (Right) RIGHT BREAST RECONSTRUCTION WITH PLACEMENT OF TISSUE EXPANDER AND FLEX HD (ACELLULAR HYDRATED DERMIS) (Right) as a surgical intervention .  The patient's history has been reviewed, patient examined, no change in status, stable for surgery.  I have reviewed the patient's chart and labs.  Questions were answered to the patient's satisfaction.     TOTH III,PAUL S

## 2017-09-27 NOTE — Op Note (Signed)
09/27/2017  10:34 AM  PATIENT:  Catherine Robbins  62 y.o. female  PRE-OPERATIVE DIAGNOSIS:  RIGHT BREAST CANCER  POST-OPERATIVE DIAGNOSIS:  RIGHT BREAST CANCER  PROCEDURE:  Procedure(s): RIGHT MASTECTOMY WITH DEEP RIGHT AXILLARY SENTINEL LYMPH NODE BIOPSY   SURGEON:  Surgeon(s) and Role: Panel 1:    * Jovita Kussmaul, MD - Primary    * Gosai, Puja, PA-C - Assisting  PHYSICIAN ASSISTANT:   ASSISTANTS: Carlena Hurl, PA   ANESTHESIA:   general  EBL:  20 mL   BLOOD ADMINISTERED:none  DRAINS: none   LOCAL MEDICATIONS USED:  NONE  SPECIMEN:  Source of Specimen:  right mastectomy and sentinel nodes X 2  DISPOSITION OF SPECIMEN:  PATHOLOGY  COUNTS:  YES  TOURNIQUET:  * No tourniquets in log *  DICTATION: .Dragon Dictation   After informed consent was obtained the patient was brought to the operating room and placed in the supine position on the operating table.  After adequate induction of general anesthesia the patient's bilateral chest, breast, and axillary areas were prepped with ChloraPrep, allowed to dry, and draped in usual sterile manner.  An appropriate timeout was performed.  Earlier in the day the patient underwent injection of 1 mCi of technetium sulfur colloid in the subareolar position on the right.  Next an elliptical incision was made around the nipple and areole a complex in order to spare as much skin as possible with a 10 blade knife.  The incision was carried through the skin and subcutaneous tissue sharply with the plasma blade.  Breast hooks were used to elevate the skin flaps anteriorly towards the saline.  Thin skin flaps were then created circumferentially between the breast tissue in the subcutaneous fat.  This dissection was carried all the way to the chest wall circumferentially.  Next the breast was removed from the pectoralis muscle with the pectoralis fascia.  Once the breast was removed it was oriented with a stitch on the lateral skin and sent to  pathology for further evaluation.  The neoprobe was set to technetium and an area of radioactivity was readily identified in the right axilla.  The neoprobe was used to direct blunt hemostat dissection.  I was able to identify 2 hot lymph nodes.  These lymph nodes were excised sharply with the plasma blade and the lymphatics were controlled with clips.  Ex vivo counts on both of these sentinel nodes were approximately 350.  They were sent to pathology for further evaluation.  No other hot or palpable lymph nodes were identified in the right axilla.  There may have been 1 or 2 palpable lymph nodes in the tail of the breast that was sent to pathology.  Hemostasis was then achieved using the plasma blade.  The wound was irrigated with saline and the skin flaps and chest wall looked healthy.  The patient had tolerated the procedure well.  At this point in the case all needle sponge and instrument counts were correct.  At this point the operation was turned over to Dr. Marla Roe for the reconstruction.  Her portion of the case will be dictated separately.  PLAN OF CARE: Admit for overnight observation  PATIENT DISPOSITION:  PACU - hemodynamically stable.   Delay start of Pharmacological VTE agent (>24hrs) due to surgical blood loss or risk of bleeding: no

## 2017-09-27 NOTE — H&P (Signed)
Catherine Robbins  Location: [facility] Patient #: [id number] DOB: [birth year] Undefined / Language: Cleophus Molt / Race: [place] Female   History of Present Illness The patient is a 62 year old female who presents with breast cancer. We are asked to see the patient in consultation by Dr. Lisbeth Robbins to evaluate her for a new right breast cancer. The patient is a 62year old [name] female who presents with 2 areas of distortion in the central right breast with 5cm between the two. one was biopsied and came back as grade 2 invasive ductal cancer that was ER and PR + and Her2 - with a Ki67 of 25%. She has a history of right breast cancer in 2006 treated with lumpectomy and radiation and antiestrogens. she also has von willebrands and lyme disease which is controlled   Past Surgical History  Breast Biopsy  Bilateral. multiple Breast Mass; Local Excision  Right. Oral Surgery  Tonsillectomy   Diagnostic Studies History  Colonoscopy  1-5 years ago Mammogram  within last year Pap Smear  1-5 years ago  Medication History  Medications Reconciled  Social History  Alcohol use  Occasional alcohol use. Caffeine use  Coffee. No drug use  Tobacco use  Former smoker.  Family History Alcohol Abuse  Mother. Arthritis  Father. Bleeding disorder  Brother, Daughter, Family Members In [place], Mother, Sister. Diabetes Mellitus  Mother. Heart Disease  Mother.  Pregnancy / Birth History Age at menarche  7 years. Age of menopause  42-50 Contraceptive History  Intrauterine device, Oral contraceptives. Gravida  3 Irregular periods  Length (months) of breastfeeding  7-12 Maternal age  63-35 Para  2  Other Problems  Arthritis  Breast Cancer  Hemorrhoids  High blood pressure  Hypercholesterolemia  Lump In Breast  Thyroid Disease     Review of Systems  General Not Present- Appetite Loss, Chills, Fatigue, Fever, Night Sweats, Weight Gain and Weight Loss. Skin  Present- Dryness. Not Present- Change in Wart/Mole, Hives, Jaundice, New Lesions, Non-Healing Wounds, Rash and Ulcer. HEENT Present- Seasonal Allergies, Sinus Pain and Wears glasses/contact lenses. Not Present- Earache, Hearing Loss, Hoarseness, Nose Bleed, Oral Ulcers, Ringing in the Ears, Sore Throat, Visual Disturbances and Yellow Eyes. Breast Not Present- Breast Mass, Breast Pain, Nipple Discharge and Skin Changes. Cardiovascular Not Present- Chest Pain, Difficulty Breathing Lying Down, Leg Cramps, Palpitations, Rapid Heart Rate, Shortness of Breath and Swelling of Extremities. Gastrointestinal Not Present- Abdominal Pain, Bloating, Bloody Stool, Change in Bowel Habits, Chronic diarrhea, Constipation, Difficulty Swallowing, Excessive gas, Gets full quickly at meals, Hemorrhoids, Indigestion, Nausea, Rectal Pain and Vomiting. Female Genitourinary Not Present- Frequency, Nocturia, Painful Urination, Pelvic Pain and Urgency. Neurological Not Present- Decreased Memory, Fainting, Headaches, Numbness, Seizures, Tingling, Tremor, Trouble walking and Weakness. Psychiatric Present- Anxiety and Change in Sleep Pattern. Not Present- Bipolar, Depression, Fearful and Frequent crying. Endocrine Not Present- Cold Intolerance, Excessive Hunger, Hair Changes, Heat Intolerance, Hot flashes and New Diabetes. Hematology Present- Blood Thinners. Not Present- Easy Bruising, Excessive bleeding, Gland problems, HIV and Persistent Infections.   Physical Exam  General Mental Status-Alert. General Appearance-Consistent with stated age. Hydration-Well hydrated. Voice-Normal.  Head and Neck Head-normocephalic, atraumatic with no lesions or palpable masses. Trachea-midline. Thyroid Gland Characteristics - normal size and consistency.  Eye Eyeball - Bilateral-Extraocular movements intact. Sclera/Conjunctiva - Bilateral-No scleral icterus.  Chest and Lung Exam Chest and lung exam reveals -quiet,  even and easy respiratory effort with no use of accessory muscles and on auscultation, normal breath sounds, no adventitious sounds and  normal vocal resonance. Inspection Chest Wall - Normal. Back - normal.  Breast Note: there is a well healed scar in the inner right breast. there is some mild palpable fullness in the upper central right breast. there is no palpable mass in the left breast. there is no palpable axillary, supraclavicular, and cervical lymphadenopathy   Cardiovascular Cardiovascular examination reveals -normal heart sounds, regular rate and rhythm with no murmurs and normal pedal pulses bilaterally.  Abdomen Inspection Inspection of the abdomen reveals - No Hernias. Skin - Scar - no surgical scars. Palpation/Percussion Palpation and Percussion of the abdomen reveal - Soft, Non Tender, No Rebound tenderness, No Rigidity (guarding) and No hepatosplenomegaly. Auscultation Auscultation of the abdomen reveals - Bowel sounds normal.  Neurologic Neurologic evaluation reveals -alert and oriented x 3 with no impairment of recent or remote memory. Mental Status-Normal.  Musculoskeletal Normal Exam - Left-Upper Extremity Strength Normal and Lower Extremity Strength Normal. Normal Exam - Right-Upper Extremity Strength Normal and Lower Extremity Strength Normal.  Lymphatic Head & Neck  General Head & Neck Lymphatics: Bilateral - Description - Normal. Axillary  General Axillary Region: Bilateral - Description - Normal. Tenderness - Non Tender. Femoral & Inguinal  Generalized Femoral & Inguinal Lymphatics: Bilateral - Description - Normal. Tenderness - Non Tender.    Assessment & Plan MALIGNANT NEOPLASM OF CENTRAL PORTION OF RIGHT BREAST IN FEMALE, ESTROGEN RECEPTOR POSITIVE (C50.111) Impression: the patient appears to have a new cancer in the upper right breast. because of her history of previous radiation she will need a right mastectomy. she will be a candidate  for sentinel node mapping again. I have discussed with her the risks and benefits of the surgery as well as some of the technical aspects and she understands and wishes to proceed. she is interested in reconstruction so I will also refer her to plastic surgery.

## 2017-09-27 NOTE — Op Note (Signed)
Op report    DATE OF OPERATION:  09/27/2017  LOCATION: [facility]  SURGICAL DIVISION: Plastic Surgery  PREOPERATIVE DIAGNOSES:  1. Right Breast cancer.    POSTOPERATIVE DIAGNOSES:  1. Right Breast cancer.   PROCEDURE:  1. Right immediate breast reconstruction with placement of Acellular Dermal Matrix and tissue expanders.  SURGEON: Claire Sanger Dillingham, DO  ASSISTANT: Shawn Rayburn, PA  ANESTHESIA:  General.   COMPLICATIONS: None.   IMPLANTS: Right  - Mentor 535 cc. Ref #[id number].  Serial Number [id number], 100 cc of injectable saline placed in the expander. Acellular Dermal Matrix 4 x 16 cm  INDICATIONS FOR PROCEDURE:  The patient, Catherine Robbins, is a 62 y.o. female born on Jun 27, 1955, is here for  immediate first stage breast reconstruction with placement of right tissue expander and Acellular dermal matrix. MRN: [id number]  CONSENT:  Informed consent was obtained directly from the patient. Risks, benefits and alternatives were fully discussed. Specific risks including but not limited to bleeding, infection, hematoma, seroma, scarring, pain, implant infection, implant extrusion, capsular contracture, asymmetry, wound healing problems, and need for further surgery were all discussed. The patient did have an ample opportunity to have her questions answered to her satisfaction.   DESCRIPTION OF PROCEDURE:  The patient was taken to the operating room by the general surgery team. SCDs were placed and IV antibiotics were given. The patient's chest was prepped and draped in a sterile fashion. A time out was performed and the implants to be used were identified.  A right mastectomy was performed.  Once the general surgery team had completed their portion of the case the patient was rendered to the plastic and reconstructive surgery team.  Right:  The pectoralis major muscle was lifted from the chest wall with release of the lateral edge and lateral  inframammary fold.  The pocket was irrigated with antibiotic solution and hemostasis was achieved with electrocautery.  The ADM was then prepared according to the manufacture guidelines and slits placed to help with postoperative fluid management.  The ADM was then sutured to the inferior and lateral edge of the inframammary fold with 2-0 PDS starting with an interrupted stitch and then a running stitch.  The lateral portion was sutured to with interrupted sutures after the expander was placed.  The expander was prepared according to the manufacture guidelines, the air evacuated and then it was placed under the ADM and pectoralis major muscle.  The inferior and lateral tabs were used to secure the expander to the chest wall with 2-0 PDS.  The drain was placed at the inframammary fold over the ADM and secured to the skin with 3-0 Silk.    The deep layers were closed with 3-0 Monocryl followed by 4-0 Monocryl.  The skin was closed with 5-0 Monocryl and then dermabond was applied.  The ABDs and breast binder were placed.  The patient tolerated the procedure well and there were no complications.  The patient was allowed to wake from anesthesia and taken to the recovery room in satisfactory condition.

## 2017-09-27 NOTE — Anesthesia Postprocedure Evaluation (Signed)
Anesthesia Post Note  Patient: Catherine Robbins  Procedure(s) Performed: RIGHT MASTECTOMY WITH SENTINEL LYMPH NODE BIOPSY (Right Breast) RIGHT BREAST RECONSTRUCTION WITH PLACEMENT OF TISSUE EXPANDER AND FLEX HD (ACELLULAR HYDRATED DERMIS) (Right )     Patient location during evaluation: PACU Anesthesia Type: General Level of consciousness: awake and alert Pain management: pain level controlled Vital Signs Assessment: post-procedure vital signs reviewed and stable Respiratory status: spontaneous breathing, nonlabored ventilation, respiratory function stable and patient connected to nasal cannula oxygen Cardiovascular status: blood pressure returned to baseline and stable Postop Assessment: no apparent nausea or vomiting Anesthetic complications: no    Last Vitals:  Vitals:   09/27/17 1237 09/27/17 1335  BP: (!) 153/88 (!) 150/99  Pulse: (!) 58 67  Resp: 11   Temp:  36.6 C  SpO2: 98% 98%    Last Pain:  Vitals:   09/27/17 1335  TempSrc: Oral  PainSc: 5                  Christalyn Goertz DAVID

## 2017-09-27 NOTE — Transfer of Care (Signed)
Immediate Anesthesia Transfer of Care Note  Patient: Catherine Robbins  Procedure(s) Performed: RIGHT MASTECTOMY WITH SENTINEL LYMPH NODE BIOPSY (Right Breast) RIGHT BREAST RECONSTRUCTION WITH PLACEMENT OF TISSUE EXPANDER AND FLEX HD (ACELLULAR HYDRATED DERMIS) (Right )  Patient Location: PACU  Anesthesia Type:General  Level of Consciousness: awake, alert  and oriented  Airway & Oxygen Therapy: Patient Spontanous Breathing  Post-op Assessment: Report given to RN, Post -op Vital signs reviewed and stable and Patient moving all extremities X 4  Post vital signs: Reviewed and stable  Last Vitals:  Vitals Value Taken Time  BP 144/84 09/27/2017 11:22 AM  Temp    Pulse 64 09/27/2017 11:24 AM  Resp 16 09/27/2017 11:24 AM  SpO2 97 % 09/27/2017 11:24 AM  Vitals shown include unvalidated device data.  Last Pain:  Vitals:   09/27/17 0805  TempSrc:   PainSc: 0-No pain         Complications: No apparent anesthesia complications

## 2017-09-28 ENCOUNTER — Encounter (HOSPITAL_COMMUNITY): Payer: Self-pay | Admitting: General Surgery

## 2017-09-28 ENCOUNTER — Other Ambulatory Visit: Payer: Self-pay

## 2017-09-28 DIAGNOSIS — D0511 Intraductal carcinoma in situ of right breast: Secondary | ICD-10-CM | POA: Diagnosis not present

## 2017-09-28 LAB — BASIC METABOLIC PANEL
Anion gap: 8 (ref 5–15)
BUN: 9 mg/dL (ref 6–20)
CALCIUM: 8.9 mg/dL (ref 8.9–10.3)
CO2: 26 mmol/L (ref 22–32)
Chloride: 104 mmol/L (ref 101–111)
Creatinine, Ser: 0.89 mg/dL (ref 0.44–1.00)
GFR calc Af Amer: >60 mL/min (ref >60)
GFR calc non Af Amer: >60 mL/min (ref >60)
GLUCOSE: 128 mg/dL — AB (ref 65–99)
Potassium: 4.4 mmol/L (ref 3.5–5.1)
Sodium: 138 mmol/L (ref 135–145)

## 2017-09-28 LAB — CBC
HCT: 36.9 % (ref 36.0–46.0)
HEMOGLOBIN: 12.1 g/dL (ref 12.0–15.0)
MCH: 29.5 pg (ref 26.0–34.0)
MCHC: 32.8 g/dL (ref 30.0–36.0)
MCV: 90 fL (ref 78.0–100.0)
Platelets: 178 10*3/uL (ref 150–400)
RBC: 4.1 MIL/uL (ref 3.87–5.11)
RDW: 14 % (ref 11.5–15.5)
WBC: 12.2 10*3/uL — ABNORMAL HIGH (ref 4.0–10.5)

## 2017-09-28 LAB — HIV ANTIBODY (ROUTINE TESTING W REFLEX): HIV Screen 4th Generation wRfx: NONREACTIVE

## 2017-09-28 MED ORDER — POLYETHYLENE GLYCOL 3350 17 G PO PACK: 17.0000 g | PACK | Freq: Every day | ORAL | 0 refills | Status: DC | PRN

## 2017-09-28 MED ORDER — DIAZEPAM 2 MG PO TABS: 2.0000 mg | ORAL_TABLET | Freq: Two times a day (BID) | ORAL | 0 refills | Status: DC | PRN

## 2017-09-28 MED ORDER — HYDROCODONE-ACETAMINOPHEN 5-325 MG PO TABS: 1.0000 | ORAL_TABLET | Freq: Four times a day (QID) | ORAL | 0 refills | Status: DC | PRN

## 2017-09-28 NOTE — Progress Notes (Signed)
Patient discharged to home. Verbalizes understanding of all discharge instructions including incision care, drain care, discharge medications, and follow up MD visits. Patient accompanied by spouse.

## 2017-09-28 NOTE — Progress Notes (Signed)
1 Day Post-Op   Subjective/Chief Complaint: No complaints other than some soreness   Objective: Vital signs in last 24 hours: Temp:  [97.9 F (36.6 C)-99.3 F (37.4 C)] 98.5 F (36.9 C) (04/30 0532) Pulse Rate:  [54-69] 69 (04/30 0532) Resp:  [11-17] 17 (04/30 0532) BP: (120-153)/(70-99) 139/71 (04/30 0532) SpO2:  [96 %-98 %] 98 % (04/30 0532) Last BM Date: 09/27/17  Intake/Output from previous day: 04/29 0701 - 04/30 0700 In: 3520 [P.O.:240; I.V.:2880; IV Piggyback:400] Out: 605 [Urine:425; Drains:150; Blood:30] Intake/Output this shift: No intake/output data recorded.  General appearance: alert and cooperative Resp: clear to auscultation bilaterally Chest wall: skin flaps look good Cardio: regular rate and rhythm GI: soft, non-tender; bowel sounds normal; no masses,  no organomegaly  Lab Results:  Recent Labs    09/28/17 0404  WBC 12.2*  HGB 12.1  HCT 36.9  PLT 178   BMET Recent Labs    09/28/17 0404  NA 138  K 4.4  CL 104  CO2 26  GLUCOSE 128*  BUN 9  CREATININE 0.89  CALCIUM 8.9   PT/INR No results for input(s): LABPROT, INR in the last 72 hours. ABG No results for input(s): PHART, HCO3 in the last 72 hours.  Invalid input(s): PCO2, PO2  Studies/Results: Nm Sentinel Node Inj-no Rpt (breast)  Result Date: 09/27/2017 Sulfur colloid was injected by the nuclear medicine technologist for melanoma sentinel node.    Anti-infectives: Anti-infectives (From admission, onward)   Start     Dose/Rate Route Frequency Ordered Stop   09/27/17 1700  ceFAZolin (ANCEF) IVPB 2g/100 mL premix     2 g 200 mL/hr over 30 Minutes Intravenous Every 8 hours 09/27/17 1326     09/27/17 0812  polymyxin B 500,000 Units, bacitracin 50,000 Units in sodium chloride 0.9 % 500 mL irrigation  Status:  Discontinued       As needed 09/27/17 0813 09/27/17 1118   09/27/17 0653  ceFAZolin (ANCEF) IVPB 2g/100 mL premix  Status:  Discontinued     2 g 200 mL/hr over 30 Minutes  Intravenous On call to O.R. 09/27/17 0653 09/27/17 1317   09/27/17 0648  ceFAZolin (ANCEF) IVPB 2g/100 mL premix     2 g 200 mL/hr over 30 Minutes Intravenous On call to O.R. 09/27/17 0648 09/27/17 0900      Assessment/Plan: s/p Procedure(s): RIGHT MASTECTOMY WITH SENTINEL LYMPH NODE BIOPSY (Right) RIGHT BREAST RECONSTRUCTION WITH PLACEMENT OF TISSUE EXPANDER AND FLEX HD (ACELLULAR HYDRATED DERMIS) (Right) Advance diet Discharge  LOS: 0 days    TOTH III,Wendell Fiebig S 09/28/2017

## 2017-09-28 NOTE — Discharge Summary (Signed)
Physician Discharge Summary  Patient ID: Catherine Robbins MRN: [id number] DOB/AGE: [birth year] 62 y.o.  Admit date: 09/27/2017 Discharge date: 09/28/2017  Admission Diagnoses:  Discharge Diagnoses:  Active Problems:   Breast cancer ([place])   Breast cancer of upper-outer quadrant of right female breast [facility])   Discharged Condition: good  Hospital Course: The patient was taken to the op0erating room and underwent a right mastectomy.  She then had reconstruction with expander and FlexHD placement.  She was cared for at the post surgical unit.  She was tolerating food, ambulating and pain was well controlled.  Consults: None  Significant Diagnostic Studies: none  Treatments: surgery  Discharge Exam: Blood pressure 139/71, pulse 69, temperature 98.5 F (36.9 C), temperature source Oral, resp. rate 17, height 5\' 10"  (1.778 m), weight 83 kg (183 lb), SpO2 98 %. General appearance: alert, cooperative and no distress Breasts: normal appearance, no masses or tenderness Incision/Wound:  Disposition: Discharge disposition: 01-Home or Self Care       Discharge Instructions    Call MD for:  difficulty breathing, headache or visual disturbances   Complete by:  As directed    Call MD for:  persistant nausea and vomiting   Complete by:  As directed    Call MD for:  redness, tenderness, or signs of infection (pain, swelling, redness, odor or green/yellow discharge around incision site)   Complete by:  As directed    Call MD for:  severe uncontrolled pain   Complete by:  As directed    Call MD for:  temperature >100.4   Complete by:  As directed    Diet general   Complete by:  As directed    Discharge wound care:   Complete by:  As directed    Drain care   Driving Restrictions   Complete by:  As directed    No driving while on pain meds   Increase activity slowly   Complete by:  As directed    Lifting restrictions   Complete by:  As directed    No heavy lifting         Signed: [place] 09/28/2017, 8:10 AM

## 2017-10-01 ENCOUNTER — Telehealth: Payer: Self-pay | Admitting: *Deleted

## 2017-10-01 NOTE — Telephone Encounter (Signed)
Ordered oncotype per Dr. Magrinat.  Faxed requisition to pathology. 

## 2017-10-11 ENCOUNTER — Other Ambulatory Visit: Payer: BC Managed Care – PPO

## 2017-10-11 ENCOUNTER — Ambulatory Visit: Payer: BC Managed Care – PPO | Admitting: Adult Health

## 2017-10-14 ENCOUNTER — Encounter (HOSPITAL_COMMUNITY): Payer: Self-pay | Admitting: Oncology

## 2017-10-14 ENCOUNTER — Telehealth: Payer: Self-pay | Admitting: *Deleted

## 2017-10-14 NOTE — Telephone Encounter (Signed)
Received oncotype results of 24/10%.

## 2017-10-18 ENCOUNTER — Inpatient Hospital Stay: Payer: BC Managed Care – PPO

## 2017-10-18 ENCOUNTER — Inpatient Hospital Stay: Payer: BC Managed Care – PPO | Admitting: Adult Health

## 2017-10-29 ENCOUNTER — Inpatient Hospital Stay: Payer: BC Managed Care – PPO | Attending: Genetic Counselor | Admitting: Adult Health

## 2017-10-29 ENCOUNTER — Telehealth: Payer: Self-pay | Admitting: Adult Health

## 2017-10-29 ENCOUNTER — Inpatient Hospital Stay: Payer: BC Managed Care – PPO

## 2017-10-29 VITALS — BP 165/97 | HR 65 | Temp 98.9°F | Resp 18 | Ht 70.0 in | Wt 182.9 lb

## 2017-10-29 DIAGNOSIS — Z170 Estrogen receptor positive status [ER+]: Secondary | ICD-10-CM | POA: Insufficient documentation

## 2017-10-29 DIAGNOSIS — D6800 Von Willebrand disease, unspecified: Secondary | ICD-10-CM

## 2017-10-29 DIAGNOSIS — Z853 Personal history of malignant neoplasm of breast: Secondary | ICD-10-CM | POA: Diagnosis not present

## 2017-10-29 DIAGNOSIS — C50411 Malignant neoplasm of upper-outer quadrant of right female breast: Secondary | ICD-10-CM | POA: Diagnosis present

## 2017-10-29 DIAGNOSIS — Z79899 Other long term (current) drug therapy: Secondary | ICD-10-CM | POA: Diagnosis not present

## 2017-10-29 DIAGNOSIS — Z79811 Long term (current) use of aromatase inhibitors: Secondary | ICD-10-CM | POA: Insufficient documentation

## 2017-10-29 DIAGNOSIS — Z87891 Personal history of nicotine dependence: Secondary | ICD-10-CM | POA: Diagnosis not present

## 2017-10-29 LAB — CBC WITH DIFFERENTIAL/PLATELET
Basophils Absolute: 0 10*3/uL (ref 0.0–0.1)
Basophils Relative: 0 %
Eosinophils Absolute: 0.3 10*3/uL (ref 0.0–0.5)
Eosinophils Relative: 4 %
HEMATOCRIT: 41.2 % (ref 34.8–46.6)
HEMOGLOBIN: 13.5 g/dL (ref 11.6–15.9)
LYMPHS ABS: 2 10*3/uL (ref 0.9–3.3)
LYMPHS PCT: 29 %
MCH: 29.3 pg (ref 25.1–34.0)
MCHC: 32.8 g/dL (ref 31.5–36.0)
MCV: 89.6 fL (ref 79.5–101.0)
MONO ABS: 0.5 10*3/uL (ref 0.1–0.9)
MONOS PCT: 7 %
NEUTROS ABS: 4.2 10*3/uL (ref 1.5–6.5)
NEUTROS PCT: 60 %
Platelets: 176 10*3/uL (ref 145–400)
RBC: 4.6 MIL/uL (ref 3.70–5.45)
RDW: 13.6 % (ref 11.2–14.5)
WBC: 6.9 10*3/uL (ref 3.9–10.3)

## 2017-10-29 LAB — COMPREHENSIVE METABOLIC PANEL
ALBUMIN: 4.5 g/dL (ref 3.5–5.0)
ALK PHOS: 55 U/L (ref 40–150)
ALT: 15 U/L (ref 0–55)
ANION GAP: 10 (ref 3–11)
AST: 25 U/L (ref 5–34)
BILIRUBIN TOTAL: 0.6 mg/dL (ref 0.2–1.2)
BUN: 14 mg/dL (ref 7–26)
CALCIUM: 10 mg/dL (ref 8.4–10.4)
CO2: 26 mmol/L (ref 22–29)
Chloride: 102 mmol/L (ref 98–109)
Creatinine, Ser: 0.88 mg/dL (ref 0.60–1.10)
GFR calc Af Amer: >60 mL/min (ref >60)
GLUCOSE: 95 mg/dL (ref 70–140)
Potassium: 4.5 mmol/L (ref 3.5–5.1)
Sodium: 138 mmol/L (ref 136–145)
Total Protein: 7.5 g/dL (ref 6.4–8.3)

## 2017-10-29 NOTE — Telephone Encounter (Signed)
Gave patient AVS and calendar of upcoming august appointments °

## 2017-10-29 NOTE — Progress Notes (Addendum)
[facility]  Telephone:([phone]) [phone] Fax:(336) [phone]     ID: Catherine Robbins DOB: 1955-07-19  MR#: [id number]  PYK#:[id number]  Patient Care Team: Carol Ada, MD as PCP - General (Family Medicine) Jovita Kussmaul, MD as Consulting Physician (General Surgery) Magrinat, Virgie Dad, MD as Consulting Physician (Oncology) Kyung Rudd, MD as Consulting Physician (Radiation Oncology) Milly Jakob, MD as Consulting Physician (Orthopedic Surgery) OTHER MD:  CHIEF COMPLAINT: Estrogen receptor positive breast cancer  CURRENT TREATMENT: Anti estrogen therapy   HISTORY OF CURRENT ILLNESS: Catherine Robbins has a history of prior right-sided breast cancer, status post lumpectomy on 07/30/2004 for a 1.1 cm invasive ductal carcinoma, grade 1, with negative margins and a single sentinel lymph node clear.  The tumor was estrogen receptor 93% positive, progesterone receptor 83% positive, with an MIB-1 of 7% and no HER-2 amplification ([id number]).  She received adjuvant radiation radiation. She took tamoxifen for only a month due to poor tolerance, including severe hot flashes. She was switched to exemestane and tolerated this well, taking it for a total of 5 years  More recently she had routine screening mammography on 08/11/2017 showing a possible abnormality in the right breast. She underwent unilateral right diagnostic mammography with tomography and right breast ultrasonography at [facility] on 08/13/2017 showing: breast density category A. By ultrasound, the new 2.6 cm irregular mass in the 11 o'clock right upper outer quadrant  posterior depth located 4 cm from the nipple is likely carcinoma. Additionally, there is a 0.4 cm irregular mass in the right upper outer quadrant anterior depth and 3 cm from the nipple. These masses correlated to mammography findings. Evaluation of the right axilla was unremarkable.  Accordingly on 08/17/2017 she proceeded to biopsy of the right breast mass in  question. The pathology from this procedure showed ([id number]): At the 11 o'clock 4 cmfn, invasive ductal carcinoma, grade II. Prognostic indicators significant for: estrogen receptor, 90% positive and progesterone receptor, 100% positive, both with strong staining intensity. Proliferation marker Ki67 at 25%. HER2 not amplified with ratios HER2/CEP17 signals 1.28 and average HER2 copies per cell 2.09.   The patient's subsequent history is as detailed below.  INTERVAL HISTORY: "Catherine Robbins" is here today for follow-up regarding her estrogen positive breast cancer.  She is doing well today.  Since her last visit, she underwent right breast mastectomy that demonstrated multifocal invasive ductal carcinoma with negative margins and no lymph node involvement.  She also underwent expander placement during that time.  She tells me that she did "ok" with surgery.  She says that she had a skin reaction to her surgical prep, she is in pain, she says she is seeing Dr. Marla Roe weekly.     REVIEW OF SYSTEMS: Catherine Robbins continues on Anastrozole.  She says she is tolerating it better than she did when she took Tamoxifen several years ago with her first breast cancer diagnosis, but not as well as she tolerated Exemestane.  She feels like the Anastrozole causes anxiety, hot flashes, and fatigue.  Catherine Robbins denies fevers or chills.  She isn't experiencing chest pain, palpitations, cough, or shortness of breath.  She denies nausea, vomiting, constipation, or diarrhea.  A detailed ROS was otherwise non contributory.   PAST MEDICAL HISTORY: Past Medical History:  Diagnosis Date  . Breast cancer in female [facility])    Right  . Cancer ([place])   . 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 Low sodium levels   . Lyme disease   . Von Willebrand disease ([place])  HTN, HLD, Von Willebrand's Disease, Lyme Disease and Bartonella (chronic since 1998) neuropathy in the feet secondary to the Lyme disease 2004. She is a DES Daughter.   PAST SURGICAL  HISTORY: Past Surgical History:  Procedure Laterality Date  . BREAST LUMPECTOMY Right   . BREAST RECONSTRUCTION WITH PLACEMENT OF TISSUE EXPANDER AND FLEX HD (ACELLULAR HYDRATED DERMIS) Right 09/27/2017   Procedure: RIGHT BREAST RECONSTRUCTION WITH PLACEMENT OF TISSUE EXPANDER AND FLEX HD (ACELLULAR HYDRATED DERMIS);  Surgeon: Wallace Going, DO;  Location: [facility];  Service: Plastics;  Laterality: Right;  . MASTECTOMY W/ SENTINEL NODE BIOPSY Right 09/27/2017   Procedure: RIGHT MASTECTOMY WITH SENTINEL LYMPH NODE BIOPSY;  Surgeon: Jovita Kussmaul, MD;  Location: [facility];  Service: General;  Laterality: Right;  . TONSILLECTOMY    Wisdom teeth extraction in the 57's   FAMILY HISTORY Family History  Problem Relation Age of Onset  . DES usage Mother   . Other Brother        DES exposure  . Heart disease Maternal Grandfather        d. 41  . Other Sister        DES exposure    The patient's father is alive at age 26. The patient's mother died at age 35 from heart disease. The patient has 1 brother and 2 sisters. She denies a history of breast, ovarian, or any other cancers in the family.   GYNECOLOGIC HISTORY:  No LMP recorded. Patient is postmenopausal. Menarche: 64-83 years old Age at first live birth: 62 years old She is GXP2. Her LMP was in 2006. She used oral contraceptives for over 5 years with no complications. She also used HRT around 2005 for about a year until her initial breast cancer diagnosis in 2006.      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 She is also in the process of retiring. Her husband, [name], is retired from Investment banker, corporate. The patient's daughter, Mickel Baas, is a Cabin crew in [place], [place]. The patient's second daughter, Claiborne Billings, works in office and child care in [place]. She has no grandchildren. She is a person of faith, but is private.      ADVANCED  DIRECTIVES:    HEALTH MAINTENANCE: Social History   Tobacco Use  . Smoking status: Former Research scientist (life sciences)  . Smokeless tobacco: Never Used  Substance Use Topics  . Alcohol use: Yes    Comment: Occasional  . Drug use: Never     Colonoscopy: 2016/ in [place]  PAP: March 2017/ normal  Bone density: 2018 at Dr. Smith/ osteopenia    Allergies  Allergen Reactions  . Bupropion Other (See Comments)    DIZZINESS UNABLE TO STAND  . Chlorhexidine Gluconate  [Chlorhexidine] Rash    Developed rash over the chest, neck and underarms where was prepped with Chlorhexidine in the OR  . Tamoxifen Other (See Comments)    EXTREME FATIGUE HOT FLASHES SWEATING    Current Outpatient Medications  Medication Sig Dispense Refill  . anastrozole (ARIMIDEX) 1 MG tablet Take 1 tablet (1 mg total) by mouth daily. 90 tablet 4  . diazepam (VALIUM) 2 MG tablet Take 1 tablet (2 mg total) by mouth every 12 (twelve) hours as needed for muscle spasms. 20 tablet 0  . HYDROcodone-acetaminophen (NORCO/VICODIN) 5-325 MG tablet Take 1-2 tablets by mouth every 6 (six) hours as needed for moderate pain. 15 tablet 0  . NP THYROID 60 MG tablet Take 60 mg  by mouth daily before breakfast.     . propranolol (INDERAL) 20 MG tablet Take 20 mg by mouth daily.      No current facility-administered medications for this visit.     OBJECTIVE:   Vitals:   10/29/17 1320  BP: (!) 165/97  Pulse: 65  Resp: 18  Temp: 98.9 F (37.2 C)  SpO2: 100%     Body mass index is 26.24 kg/m.   Wt Readings from Last 3 Encounters:  10/29/17 182 lb 14.4 oz (83 kg)  09/27/17 183 lb (83 kg)  09/17/17 183 lb 6 oz (83.2 kg)  ECOG FS:0 - Asymptomatic GENERAL: Patient is a well appearing female in no acute distress HEENT:  Sclerae anicteric.  Oropharynx clear and moist. No ulcerations or evidence of oropharyngeal candidiasis. Neck is supple.  NODES:  No cervical, supraclavicular, or axillary lymphadenopathy palpated.  BREAST EXAM:  S/p right  mastectomy and reconstruction.  Healing well LUNGS:  Clear to auscultation bilaterally.  No wheezes or rhonchi. HEART:  Regular rate and rhythm. No murmur appreciated. ABDOMEN:  Soft, nontender.  Positive, normoactive bowel sounds. No organomegaly palpated. MSK:  No focal spinal tenderness to palpation. Full range of motion bilaterally in the upper extremities. EXTREMITIES:  No peripheral edema.   SKIN:  Clear with no obvious rashes or skin changes. No nail dyscrasia. NEURO:  Nonfocal. Well oriented.  Appropriate affect.     LAB RESULTS:  CMP     Component Value Date/Time   NA 138 09/28/2017 0404   K 4.4 09/28/2017 0404   CL 104 09/28/2017 0404   CO2 26 09/28/2017 0404   GLUCOSE 128 (H) 09/28/2017 0404   BUN 9 09/28/2017 0404   CREATININE 0.89 09/28/2017 0404   CREATININE 0.92 08/30/2017 1145   CALCIUM 8.9 09/28/2017 0404   PROT 7.6 08/30/2017 1145   ALBUMIN 4.4 08/30/2017 1145   AST 28 08/30/2017 1145   ALT 19 08/30/2017 1145   ALKPHOS 57 08/30/2017 1145   BILITOT 0.6 08/30/2017 1145   GFRNONAA >60 09/28/2017 0404   GFRNONAA >60 08/30/2017 1145   GFRAA >60 09/28/2017 0404   GFRAA >60 08/30/2017 1145    No results found for: TOTALPROTELP, ALBUMINELP, A1GS, A2GS, BETS, BETA2SER, GAMS, MSPIKE, SPEI  No results found for: KPAFRELGTCHN, LAMBDASER, KAPLAMBRATIO  Lab Results  Component Value Date   WBC 6.9 10/29/2017   NEUTROABS 4.2 10/29/2017   HGB 13.5 10/29/2017   HCT 41.2 10/29/2017   MCV 89.6 10/29/2017   PLT 176 10/29/2017    @LASTCHEMISTRY @  No results found for: LABCA2  No components found for: [id number]  No results for input(s): INR in the last 168 hours.  No results found for: LABCA2  No results found for: ZGY174  No results found for: BSW967  No results found for: RFF638  No results found for: CA2729  No components found for: HGQUANT  No results found for: CEA1 / No results found for: CEA1   No results found for: AFPTUMOR  No results  found for: CHROMOGRNA  No results found for: PSA1  Appointment on 10/29/2017  Component Date Value Ref Range Status  . WBC 10/29/2017 6.9  3.9 - 10.3 K/uL Final  . RBC 10/29/2017 4.60  3.70 - 5.45 MIL/uL Final  . Hemoglobin 10/29/2017 13.5  11.6 - 15.9 g/dL Final  . HCT 10/29/2017 41.2  34.8 - 46.6 % Final  . MCV 10/29/2017 89.6  79.5 - 101.0 fL Final  . MCH 10/29/2017 29.3  25.1 - 34.0  pg Final  . MCHC 10/29/2017 32.8  31.5 - 36.0 g/dL Final  . RDW 10/29/2017 13.6  11.2 - 14.5 % Final  . Platelets 10/29/2017 176  145 - 400 K/uL Final  . Neutrophils Relative % 10/29/2017 60  % Final  . Neutro Abs 10/29/2017 4.2  1.5 - 6.5 K/uL Final  . Lymphocytes Relative 10/29/2017 29  % Final  . Lymphs Abs 10/29/2017 2.0  0.9 - 3.3 K/uL Final  . Monocytes Relative 10/29/2017 7  % Final  . Monocytes Absolute 10/29/2017 0.5  0.1 - 0.9 K/uL Final  . Eosinophils Relative 10/29/2017 4  % Final  . Eosinophils Absolute 10/29/2017 0.3  0.0 - 0.5 K/uL Final  . Basophils Relative 10/29/2017 0  % Final  . Basophils Absolute 10/29/2017 0.0  0.0 - 0.1 K/uL Final   Performed at [facility], [place] [address], [place], [place] [address]    (this displays the last labs from the last 3 days)  No results found for: TOTALPROTELP, ALBUMINELP, A1GS, A2GS, BETS, BETA2SER, GAMS, MSPIKE, SPEI (this displays SPEP labs)  No results found for: KPAFRELGTCHN, LAMBDASER, KAPLAMBRATIO (kappa/lambda light chains)  No results found for: HGBA, HGBA2QUANT, HGBFQUANT, HGBSQUAN (Hemoglobinopathy evaluation)   No results found for: LDH  No results found for: IRON, TIBC, IRONPCTSAT (Iron and TIBC)  No results found for: FERRITIN  Urinalysis No results found for: COLORURINE, APPEARANCEUR, LABSPEC, PHURINE, GLUCOSEU, HGBUR, BILIRUBINUR, KETONESUR, PROTEINUR, UROBILINOGEN, NITRITE, LEUKOCYTESUR   STUDIES: Mammographic and ultrasound studies reviewed with the patient  ELIGIBLE FOR AVAILABLE  RESEARCH PROTOCOL: no  ASSESSMENT: 62 y.o. [place], [place] woman  (1) status post right lumpectomy and sentinel lymph node sampling 07/30/2004 for a pT1c pN0, stage IA invasive breast cancer, grade 1, estrogen and progesterone receptor positive, HER-2 not amplified, with an MIB-1 of 7%  (a) s/p adjuvant radiation  (b) intolerant of tamoxifen  (c) on aromasin for 5 years w/o complications  (2) status post right breast upper outer quadrant biopsy 08/17/2017 for a clinical T2 N0, stage IB invasive ductal carcinoma, grade 2, estrogen and progesterone receptor positive, HER-2 not amplified, with an MIB-1 of 25%  (a) a second area of concern measuring 0.4 cm was located in the upper inner quadrant  (3) right mastectomy on 09/27/2017 (Dr. Marlou Starks) showed multifocal invasive ductal carcinoma grade II, 2 tumors, 2.4cm and 0.5cm, margins negative, 2 SLN negative.  pT2, pN0, stage IB  (4) Oncotype DX from 08/17/17: 24 (intermediate risk), predicting a 9-year risk of recurrence outside the breast of 10% if the patient's only systemic therapy is tamoxifen for 5 years.  It also predicts no significant benefit from chemotherapy.    (5) anastrozole started neoadjuvantly in anticipation of some surgical delays  (6) genetics testing on 09/08/2017 shows no deleterious mutations.  Genes tested include: APC, ATM, AXIN2, BARD1, BMPR1A, BRCA1, BRCA2, BRIP1, CDH1, CDK4, CDKN2A (p14ARF), CDKN2A (p16INK4a), CHEK2, CTNNA1, DICER1, EPCAM (Deletion/duplication testing only), GREM1 (promoter region deletion/duplication testing only), KIT, MEN1, MLH1, MSH2, MSH3, MSH6, MUTYH, NBN, NF1, NHTL1, PALB2, PDGFRA, PMS2, POLD1, POLE, PTEN, RAD50, RAD51C, RAD51D, SDHB, SDHC, SDHD, SMAD4, SMARCA4. STK11, TP53, TSC1, TSC2, and VHL.  The following genes were evaluated for sequence changes only: SDHA and HOXB13 c.251G>A variant only.  (7) history of von Willebrand disease  PLAN:  Catherine Robbins is doing well.  She conitnues on Anastrozole daily with  fairly good tolerance.  She met with Dr. Jana Hakim today as well.  He reviewed her pathology results and her oncotype results.  She will not need radiation or chemotherapy.  She will continue on Anastrozole daily.  Should she have any worsening side effects, she can certainly change to Exemestane daily as she tolerated this well many years ago with her previous diagnosis.  She will let us know.    Catherine Robbins will return in 3 months for labs and f/u with Dr. Jana Hakim.    Catherine Robbins has a good understanding of the overall plan. She agrees with it. She knows the goal of treatment in her case is cure. She will call with any problems that may develop before her next visit here.   Wilber Bihari, NP  10/29/17 1:49 PM Medical Oncology and Hematology [facility] [address] [place], [place] [address] Tel. [phone]    Fax. [phone]    ADDENDUM: I met with Catherine Robbins today and reviewed her situation in detail.  She understands that there is no role for postmastectomy radiation in her case.  She is already on anastrozole and is tolerating that well.  The plan will be to continue that for a minimum 5 years.  If her Oncotype score had been just a couple of points higher we would have considered chemotherapy.  However she is in the intermediate group and we are treating her accordingly.  I am going to see her again in August just to make sure she is stable and after that I intend to broaden the follow-up interval.  I recommend that she consider the finding your new normal group as she moves forward.  I personally saw this patient and performed a substantive portion of this encounter with the listed APP documented above.   Chauncey Cruel, MD Medical Oncology and Hematology [facility] [address] [place], [place] [address] Tel. [phone]    Fax. [phone]

## 2017-11-03 ENCOUNTER — Encounter: Payer: Self-pay | Admitting: *Deleted

## 2017-11-04 LAB — VON WILLEBRAND FACTOR MULTIMER

## 2017-11-05 ENCOUNTER — Ambulatory Visit: Payer: BC Managed Care – PPO | Attending: General Surgery | Admitting: Physical Therapy

## 2017-11-05 ENCOUNTER — Encounter: Payer: Self-pay | Admitting: Physical Therapy

## 2017-11-05 DIAGNOSIS — C50411 Malignant neoplasm of upper-outer quadrant of right female breast: Secondary | ICD-10-CM | POA: Insufficient documentation

## 2017-11-05 DIAGNOSIS — Z483 Aftercare following surgery for neoplasm: Secondary | ICD-10-CM | POA: Insufficient documentation

## 2017-11-05 DIAGNOSIS — Z170 Estrogen receptor positive status [ER+]: Secondary | ICD-10-CM | POA: Diagnosis present

## 2017-11-05 DIAGNOSIS — R293 Abnormal posture: Secondary | ICD-10-CM | POA: Diagnosis present

## 2017-11-05 NOTE — Therapy (Signed)
[place], [place], [address] Phone: [phone]   Fax:  [phone]  Physical Therapy Evaluation  Patient Details  Name: Catherine Robbins MRN: [id number] Date of Birth: Feb 24, 1956 Referring Provider: Dr. Audelia Hives    Encounter Date: 11/05/2017    Past Medical History:  Diagnosis Date  . Breast cancer in female [facility])    Right  . Cancer ([place])   . 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 Low sodium levels   . Lyme disease   . Von Willebrand disease ([place])     Past Surgical History:  Procedure Laterality Date  . BREAST LUMPECTOMY Right   . BREAST RECONSTRUCTION WITH PLACEMENT OF TISSUE EXPANDER AND FLEX HD (ACELLULAR HYDRATED DERMIS) Right 09/27/2017   Procedure: RIGHT BREAST RECONSTRUCTION WITH PLACEMENT OF TISSUE EXPANDER AND FLEX HD (ACELLULAR HYDRATED DERMIS);  Surgeon: [place] Going, DO;  Location: [place];  Service: Plastics;  Laterality: Right;  . MASTECTOMY W/ SENTINEL NODE BIOPSY Right 09/27/2017   Procedure: RIGHT MASTECTOMY WITH SENTINEL LYMPH NODE BIOPSY;  Surgeon: Jovita Kussmaul, MD;  Location: [place];  Service: General;  Laterality: Right;  . TONSILLECTOMY      There were no vitals filed for this visit.   Subjective Assessment - 11/05/17 1019    Subjective  complains of pain across her chest .  She is having severe pain after her fills every Tueday. Pt states exercises alot at the gym  Had been doing up to 90 minutes a day     Pertinent History  Patient was diagnosed on 08/11/17 with right grade 2 invasive ductal carcinoma breast cancer. It measures 2.6 cm and 6 mm in 2 areas in the upper outer quadrant. It is ER/PR positive and HER2 negative with a Ki67 of 25%. She has a history of a right lumpectomy and sentinel node biopsy (0/1 nodes) and radiation in 3/06. Pt underwent mastectomy with immediate expander with one node removed on 09/27/2017. she is getting weekly expander fills and plans to  have implant exchange on august 9     Patient Stated Goals  to help with tightness at chest  and learn the exercise program to help decrase lymphedema risk     Currently in Pain?  Yes    Pain Score  2     Pain Location  Chest    Pain Orientation  Right    Pain Descriptors / Indicators  Tightness;Aching    Pain Type  Acute pain    Pain Radiating Towards  occasionally under her right arm     Pain Onset  More than a month ago    Pain Frequency  Intermittent    Aggravating Factors   worse with movment and after fills     Pain Relieving Factors  rest     Effect of Pain on Daily Activities  can 't do much for a couple of days after the fills          [facility] PT Assessment - 11/05/17 0001      Assessment   Medical Diagnosis  Right breast cancer    Referring Provider  Dr. Lyndee Leo Dillingham     Onset Date/Surgical Date  08/11/17    Hand Dominance  Right    Prior Therapy  none      Precautions   Precautions  Other (comment)    Precaution Comments  active cancer and already at risk for right arm lymphedema due to previous breast cancer  surgery in 2006      Restrictions   Weight Bearing Restrictions  No      Balance Screen   Has the patient fallen in the past 6 months  No    Has the patient had a decrease in activity level because of a fear of falling?   No    Is the patient reluctant to leave their home because of a fear of falling?   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    Leisure  trying to get to her exercise programs at the [employer]   Overall Cognitive Status  Within Functional Limits for tasks assessed      Observation/Other Assessments   Quick DASH   31.82      Posture/Postural Control    Posture/Postural Control  Postural limitations    Postural Limitations  Rounded Shoulders;Forward head      AROM   Right Shoulder Extension  --    Right Shoulder Flexion  145 Degrees    Right Shoulder ABduction  110 Degrees    Right Shoulder Internal Rotation  --    Right Shoulder External Rotation  --    Left Shoulder Extension  --    Left Shoulder Flexion  165 Degrees    Left Shoulder ABduction  170 Degrees    Left Shoulder Internal Rotation  --    Left Shoulder External Rotation  --    Cervical Flexion  --    Cervical Extension  --    Cervical - Right Side Bend  --    Cervical - Left Side Bend  --    Cervical - Right Rotation  --    Cervical - Left Rotation  --      Strength   Overall Strength  Within functional limits for tasks performed      Palpation   Palpation comment  one guitar string cord palpable in right axilla.  Pt has fullness in right chest above expander  with tenderness         LYMPHEDEMA/ONCOLOGY QUESTIONNAIRE - 11/05/17 1049      Right Upper Extremity Lymphedema   10 cm Proximal to Olecranon Process  29.5 cm    Olecranon Process  26.4 cm    10 cm Proximal to Ulnar Styloid Process  23.8 cm    Just Proximal to Ulnar Styloid Process  17.7 cm    Across Hand at [employer]  20.5 cm    At [place] of 2nd Digit  7 cm          Quick Dash - 11/05/17 0001    Open a tight or new jar  Mild difficulty    Do heavy household chores (wash walls, wash floors)  Mild difficulty    Carry a shopping bag or briefcase  Mild difficulty    Wash your back  No difficulty    Use a knife to cut food  Mild difficulty    Recreational activities in which you take some force or impact through your arm, shoulder, or hand (golf, hammering, tennis)  Moderate difficulty    During the past  week, to what extent has your arm, shoulder or hand problem interfered with your normal social activities with family, friends, neighbors, or groups?  Not at all    During the past week, to what  extent has your arm, shoulder or hand problem limited your work or other regular daily activities  Modererately    Arm, shoulder, or hand pain.  Moderate    Tingling (pins and needles) in your arm, shoulder, or hand  Moderate    Difficulty Sleeping  Moderate difficulty    DASH Score  31.82 %        Objective measurements completed on examination: See above findings.      [facility] [facility] Adult PT Treatment/Exercise - 11/05/17 0001      Self-Care   Self-Care  Other Self-Care Comments    Other Self-Care Comments   educated pt about ABC class and prophylactic sleeve       Exercises   Exercises  Shoulder      Shoulder Exercises: Supine   Other Supine Exercises  supine dowel flexion, abduction and rotation                   PT Long Term Goals - 11/05/17 1239      PT LONG TERM GOAL #1   Title  Pt will be independent in progressive exercise for UE stretching and strengthening     Time  4    Period  Weeks    Status  New      PT LONG TERM GOAL #2   Title  Pt will be independent in lymphedema risk reduction practices     Time  4    Period  Weeks    Status  New      PT LONG TERM GOAL #3   Title  Pt will have 150 degrees of painfree right shoulder  abduction so she can return to ususal activities     Time  [facility] Goals - 08/25/17 2019      Patient will be able to verbalize understanding of pertinent lymphedema risk reduction practices relevant to her diagnosis specifically related to skin care.   Time  1    Period  Days    Status  Achieved      Patient will be able to return demonstrate and/or verbalize understanding of the post-op home exercise program related to regaining shoulder range of motion.   Time  1    Period  Days    Status  Achieved      Patient will be able to verbalize understanding of the importance of attending the postoperative After Breast Cancer Class for further lymphedema risk reduction education and  therapeutic exercise.   Time  1    Period  Days    Status  Achieved            Plan - 11/05/17 1243    Clinical Impression Statement  Pt comes to PT with tightness across chest and right axilla with decreased right shoulder range of motion after  mastectomy with immediate tissue expander.  She will benefit from PT for prgressive stretching and lymphedema risk reduction education     History and Personal Factors relevant to plan of care:  previous breast cancer on same side     Clinical Presentation  Stable    Clinical Decision Making  Low    Rehab Potential  Excellent    Clinical Impairments Affecting Rehab Potential  ongoing expander fills     PT Frequency  2x / week    PT Duration  4 weeks    PT Treatment/Interventions  ADLs/Self Care Home Management;Therapeutic exercise;Patient/family education;Manual techniques;Passive range of motion;Scar mobilization;Manual lymph drainage;Therapeutic activities;DME Instruction;Orthotic Fit/Training    PT Next Visit Plan  issue information about ABC class, gentle ROM and stretching to right upper quadrant and axillary cord     Consulted and Agree with Plan of Care  Patient       Patient will benefit from skilled therapeutic intervention in order to improve the following deficits and impairments:  Decreased range of motion, Decreased knowledge of precautions, Impaired UE functional use, Postural dysfunction, Pain, Decreased knowledge of use of DME, Increased fascial restricitons  Visit Diagnosis: Aftercare following surgery for neoplasm - Plan: PT plan of care cert/re-cert  Abnormal posture - Plan: PT plan of care cert/re-cert     Problem List Patient Active Problem List   Diagnosis Date Noted  . Breast cancer ([place]) 09/27/2017  . Breast cancer of upper-outer quadrant of right female breast ([place]) 09/27/2017  . Genetic testing 09/07/2017  . Von Willebrand disease ([place]) 08/30/2017  . Malignant neoplasm of upper-outer quadrant of right  breast in female, estrogen receptor positive ([place]) 08/20/2017   Donato Heinz. Owens Shark PT   Norwood Levo 11/05/2017, 12:49 PM  [place], [place], [address] Phone: [phone]   Fax:  [phone]  Name: Catherine Robbins MRN: [id number] Date of Birth: Nov 18, 1955

## 2017-11-05 NOTE — Patient Instructions (Signed)
SHOULDER: Flexion - Supine (Cane)        Cancer Rehab [phone]    Hold cane in both hands. Raise arms up overhead. Do not allow back to arch. Hold _5__ seconds. Do __5-10__ times; __1-2__ times a day.   SELF ASSISTED WITH OBJECT: Shoulder Abduction / Adduction - Supine    Hold cane with both hands. Move both arms from side to side, keep elbows straight.  Hold when stretch felt for __5__ seconds. Repeat __5-10__ times; __1-2__ times a day. Once this becomes easier progress to third picture bringing affected arm towards ear by staying out to side. Same hold for _5_seconds. Repeat  _5-10_ times, _1-2_ times/day.  Shoulder Blade Stretch    Clasp fingers behind head with elbows touching in front of face. Pull elbows back while pressing shoulder blades together. Relax and hold as tolerated, can place pillow under elbow here for comfort as needed and to allow for prolonged stretch.  Repeat __5__ times. Do __1-2__ sessions per day.     SHOULDER: External Rotation - Supine (Cane)    Hold cane with both hands. Rotate arm away from body. Keep elbow on floor and next to body. _5-10__ reps per set, hold 5 seconds, _1-2__ sets per day. Add towel to keep elbow at side.  Copyright  VHI. All rights reserved.      First of all, check with your insurance company to see if provider is in [place] (for wigs and compression sleeves / gloves/gauntlets )  [place], [place] [address] [phone]  Will file some insurances --- call for appointment   Second to [facility] (for mastectomy prosthetics and garments) [place], [place] [address] [phone] Will file some insurances --- call for appointment  [facility]  [address]  [place], [place] [address] [phone] Lower extremity garments  [facility] [place] [address] [place], [place]  [address] [place] ( Medicaid certified  lymphedema fitter) [phone] [email or web address] .[place] [place],  [address] [phone]  Other Resources: National Lymphedema Network:  www.lymphnet.org www.Klosetraining.com for patient articles and self manual lymph drainage information www.lymphedemablog.com has informative articles.  DishTag.es.com www.lymphedemaproducts.com www.brightlifedirect.com

## 2017-11-10 ENCOUNTER — Ambulatory Visit: Payer: BC Managed Care – PPO | Admitting: Physical Therapy

## 2017-11-10 DIAGNOSIS — Z483 Aftercare following surgery for neoplasm: Secondary | ICD-10-CM | POA: Diagnosis not present

## 2017-11-10 DIAGNOSIS — R293 Abnormal posture: Secondary | ICD-10-CM

## 2017-11-10 NOTE — Patient Instructions (Signed)
Lower Trunk Rotation    Start on your back, arms outstretched, with knees bent and feet flat on mattress. (Later, to advance this: Bring both knees in to chest.)   Rotate knees to left, keeping knees together.  Hold for 30 seconds if you can. Repeat __2__ times per set. Do __1__ sets per session. Do __2__ sessions per day.   Pectoralis Stretch With Scapula Adduction: Supine (Towel Roll)    Lie with rolled towel under spine vertically. Gently squeeze shoulder blades together (or squeeze them back). Hold 5 seconds. Do _5-10__ times, __2_ times per day.  http://ss.exer.us/357   Copyright  VHI. All rights reserved.   Pectoralis Stretch: Supine (Towel Roll)    Lie with rolled towel under spine, letting shoulders relax toward floor. Stretch your arms out in a "T" position. Then raise hands straight up over chest toward ceiling. Repeat stretching arms out to sides. Hold 3-5 seconds. Do 10 reps, 2x/day.  REPEAT THIS BUT BRING YOUR ARMS UP AND OUT IN A "V" SHAPE.  http://ss.exer.us/355   Copyright  VHI. All rights reserved.     http://orth.exer.us/151   Copyright  VHI. All rights reserved.

## 2017-11-10 NOTE — Therapy (Signed)
[place], [place], [address] Phone: [phone]   Fax:  [phone]  Physical Therapy Treatment  Patient Details  Name: Catherine Robbins MRN: [id number] Date of Birth: 11-16-1955 Referring Provider: Dr. Audelia Hives    Encounter Date: 11/10/2017  PT End of Session - 11/10/17 1719    Visit Number  3    Number of Visits  10    Date for PT Re-Evaluation  12/10/17    PT Start Time  0263    PT Stop Time  1431    PT Time Calculation (min)  44 min    Activity Tolerance  Patient tolerated treatment well    Behavior During Therapy  [facility] for tasks assessed/performed       Past Medical History:  Diagnosis Date  . Breast cancer in female [facility])    Right  . Cancer ([place])   . 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 Low sodium levels   . Lyme disease   . Von Willebrand disease ([place])     Past Surgical History:  Procedure Laterality Date  . BREAST LUMPECTOMY Right   . BREAST RECONSTRUCTION WITH PLACEMENT OF TISSUE EXPANDER AND FLEX HD (ACELLULAR HYDRATED DERMIS) Right 09/27/2017   Procedure: RIGHT BREAST RECONSTRUCTION WITH PLACEMENT OF TISSUE EXPANDER AND FLEX HD (ACELLULAR HYDRATED DERMIS);  Surgeon: [place] Going, DO;  Location: [place];  Service: Plastics;  Laterality: Right;  . MASTECTOMY W/ SENTINEL NODE BIOPSY Right 09/27/2017   Procedure: RIGHT MASTECTOMY WITH SENTINEL LYMPH NODE BIOPSY;  Surgeon: Jovita Kussmaul, MD;  Location: [place];  Service: General;  Laterality: Right;  . TONSILLECTOMY      There were no vitals filed for this visit.  Subjective Assessment - 11/10/17 1351    Subjective  Didn't get another fill yesterday; got a prescription for hydrocodone because of breast pain.    Pertinent History  Patient was diagnosed on 08/11/17 with right grade 2 invasive ductal carcinoma breast cancer. It measures 2.6 cm and 6 mm in 2 areas in the upper outer quadrant. It is ER/PR positive and HER2 negative with a  Ki67 of 25%. She has a history of a right lumpectomy and sentinel node biopsy (0/1 nodes) and radiation in 3/06. Pt underwent mastectomy with immediate expander with one node removed on 09/27/2017. she is getting weekly expander fills and plans to have implant exchange on august 9     Patient Stated Goals  to help with tightness at chest  and learn the exercise program to help decrase lymphedema risk     Currently in Pain?  Yes    Pain Score  4     Pain Location  Chest    Pain Orientation  Right    Pain Descriptors / Indicators  Tightness    Aggravating Factors   my exercises    Pain Relieving Factors  rest                       [facility] Adult PT Treatment/Exercise - 11/10/17 0001      Shoulder Exercises: Supine   Horizontal ABduction  AROM;Both;10 reps supine over foam roller    Other Supine Exercises  supine hooklying with arms outstretched, lower trunk rotation to left for right chest stretch; supine over foam roller, active bilat. shoulder D2 x 10;       Manual Therapy   Manual Therapy  --    Soft tissue mobilization  to cording  at right axilla, but that is tender, so this was limited today    Myofascial Release  crosshands across right axilla    Passive ROM  supine over foam roller, passive bilat. pect minor stretches, prolonged             PT Education - 11/10/17 1356    Education provided  Yes    Education Details  about ABC class, but patient may or may not attend; supine lower trunk rotation and supine over towel roll horizontal abduction and active D2.    Person(s) Educated  Patient    Methods  Explanation she declined handout as she has it already    Comprehension  Verbalized understanding          PT Long Term Goals - 11/05/17 1239      PT LONG TERM GOAL #1   Title  Pt will be independent in progressive exercise for UE stretching and strengthening     Time  4    Period  Weeks    Status  New      PT LONG TERM GOAL #2   Title  Pt will be  independent in lymphedema risk reduction practices     Time  4    Period  Weeks    Status  New      PT LONG TERM GOAL #3   Title  Pt will have 150 degrees of painfree right shoulder  abduction so she can return to ususal activities     Time  [facility] Goals - 08/25/17 2019      Patient will be able to verbalize understanding of pertinent lymphedema risk reduction practices relevant to her diagnosis specifically related to skin care.   Time  1    Period  Days    Status  Achieved      Patient will be able to return demonstrate and/or verbalize understanding of the post-op home exercise program related to regaining shoulder range of motion.   Time  1    Period  Days    Status  Achieved      Patient will be able to verbalize understanding of the importance of attending the postoperative After Breast Cancer Class for further lymphedema risk reduction education and therapeutic exercise.   Time  1    Period  Days    Status  Achieved           Plan - 11/10/17 1721    Clinical Impression Statement  Pt. did well with some manual therapy today and learning a few more stretches to do at home. I told her she could leave out the post-op ROM exercises from breast [place] and focus on the dowel exercises and the newer stretches from today. She is willing to do whatever we suggest.    Rehab Potential  Excellent    Clinical Impairments Affecting Rehab Potential  ongoing expander fills     PT Frequency  2x / week    PT Duration  4 weeks    PT Treatment/Interventions  ADLs/Self Care Home Management;Therapeutic exercise;Patient/family education;Manual techniques;Passive range of motion;Scar mobilization;Manual lymph drainage;Therapeutic activities;DME Instruction;Orthotic Fit/Training    PT Next Visit Plan  continue manual work for stretching and to work on cording; progress HEP prn;     PT Home Exercise Plan  supine dowel, hooklying lower trunk rotation  with arms outstretched, supine over towel roll  Ts and Ys as well as scapular retraction    Consulted and Agree with Plan of Care  Patient       Patient will benefit from skilled therapeutic intervention in order to improve the following deficits and impairments:  Decreased range of motion, Decreased knowledge of precautions, Impaired UE functional use, Postural dysfunction, Pain, Decreased knowledge of use of DME, Increased fascial restricitons  Visit Diagnosis: Aftercare following surgery for neoplasm  Abnormal posture     Problem List Patient Active Problem List   Diagnosis Date Noted  . Breast cancer ([place]) 09/27/2017  . Breast cancer of upper-outer quadrant of right female breast ([place]) 09/27/2017  . Genetic testing 09/07/2017  . Von Willebrand disease ([place]) 08/30/2017  . Malignant neoplasm of upper-outer quadrant of right breast in female, estrogen receptor positive ([place]) 08/20/2017    SALISBURY,DONNA 11/10/2017, 5:24 PM  [place], [place], [address] Phone: [phone]   Fax:  [phone]  Name: Catherine Robbins MRN: [id number] Date of Birth: Dec 30, 1955  Serafina Royals, PT 11/10/17 5:24 PM

## 2017-11-18 ENCOUNTER — Ambulatory Visit: Payer: BC Managed Care – PPO | Admitting: Physical Therapy

## 2017-11-18 DIAGNOSIS — R293 Abnormal posture: Secondary | ICD-10-CM

## 2017-11-18 DIAGNOSIS — Z483 Aftercare following surgery for neoplasm: Secondary | ICD-10-CM | POA: Diagnosis not present

## 2017-11-19 NOTE — Therapy (Signed)
[place], [place], [address] Phone: [phone]   Fax:  [phone]  Physical Therapy Treatment  Patient Details  Name: Catherine Robbins MRN: [id number] Date of Birth: [birth year] Referring Provider: Dr. Audelia Hives    Encounter Date: 11/18/2017  PT End of Session - 11/19/17 0750    Visit Number  4    Number of Visits  10    Date for PT Re-Evaluation  12/10/17    PT Start Time  3299    PT Stop Time  1100    PT Time Calculation (min)  45 min       Past Medical History:  Diagnosis Date  . Breast cancer in female [facility])    Right  . Cancer ([place])   . 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 Low sodium levels   . Lyme disease   . Von Willebrand disease ([place])     Past Surgical History:  Procedure Laterality Date  . BREAST LUMPECTOMY Right   . BREAST RECONSTRUCTION WITH PLACEMENT OF TISSUE EXPANDER AND FLEX HD (ACELLULAR HYDRATED DERMIS) Right 09/27/2017   Procedure: RIGHT BREAST RECONSTRUCTION WITH PLACEMENT OF TISSUE EXPANDER AND FLEX HD (ACELLULAR HYDRATED DERMIS);  Surgeon: Wallace Going, DO;  Location: [place];  Service: Plastics;  Laterality: Right;  . MASTECTOMY W/ SENTINEL NODE BIOPSY Right 09/27/2017   Procedure: RIGHT MASTECTOMY WITH SENTINEL LYMPH NODE BIOPSY;  Surgeon: Jovita Kussmaul, MD;  Location: [place];  Service: General;  Laterality: Right;  . TONSILLECTOMY      There were no vitals filed for this visit.                    [facility] Adult PT Treatment/Exercise - 11/19/17 0001      Self-Care   Self-Care  Other Self-Care Comments    Other Self-Care Comments   issued medium tg soft to see if it would help with cording in upper arm .  told pt she [facility] but do not use any added weights and modify push ups and planks to doing them on the wall       Lumbar Exercises: Supine   Other Supine Lumbar Exercises  "bicycle" with legs only x 10  reps with cues to keep core engaged and back flat       Lumbar Exercises: Quadruped   Opposite Arm/Leg Raise  Right arm/Left leg;Left arm/Right leg;5 reps    Other Quadruped Lumbar Exercises  cat/cow for thoracic stretch       Shoulder Exercises: Supine   Protraction  AROM;Right;10 reps    Other Supine Exercises  active PNF elvation diagonal with attentiont to do internal and external rotation of entire arm       Shoulder Exercises: Sidelying   ABduction  Right;5 reps    Other Sidelying Exercises  hand pointed to ceiling for 5 circles in each direction     Other Sidelying Exercises  backward rotation of thoracic spine with arm across chest and with arm outstretched for anterior chest stretch       Shoulder Exercises: Therapy Ball   Other Therapy Ball Exercises  supine over squishy ball at upper back and head supported on pillow to open front of chest                   PT Long Term Goals - 11/19/17 0808      PT LONG  TERM GOAL #1   Title  Pt will be independent in progressive exercise for UE stretching and strengthening     Time  4    Period  Weeks    Status  On-going      PT LONG TERM GOAL #2   Title  Pt will be independent in lymphedema risk reduction practices     Time  4    Period  Weeks    Status  On-going      PT LONG TERM GOAL #3   Title  Pt will have 150 degrees of painfree right shoulder  abduction so she can return to ususal activities     [facility] Goals - 08/25/17 2019      Patient will be able to verbalize understanding of pertinent lymphedema risk reduction practices relevant to her diagnosis specifically related to skin care.   Time  1    Period  Days    Status  Achieved      Patient will be able to return demonstrate and/or verbalize understanding of the post-op home exercise program related to regaining shoulder range of motion.   Time  1    Period  Days    Status  Achieved      Patient will be  able to verbalize understanding of the importance of attending the postoperative After Breast Cancer Class for further lymphedema risk reduction education and therapeutic exercise.   Time  1    Period  Days    Status  Achieved           Plan - 11/19/17 0802    Clinical Impression Statement  Pt continues to do well with range of motion, but still has pain in right anterior chest with cording in axilla.  Tried medium tg soft today to see if it will give her any relief and suggested that pt could start to go back [facility] [employer] with no [facility]     Clinical Impairments Affecting Rehab Potential  ongoing expander fills     PT Treatment/Interventions  ADLs/Self Care Home Management;Therapeutic exercise;Patient/family education;Manual techniques;Passive range of motion;Scar mobilization;Manual lymph drainage;Therapeutic activities;DME Instruction;Orthotic Fit/Training    PT Next Visit Plan  measure right shoulder ROM for goal assessment and review lymphedema risk reduction practices check to see if tg soft was effective, teach Strength ABC program , continue manual work for stretching and to work on cordingas needed ; progress HEP prn;     PT Home Exercise Plan  supine dowel, hooklying lower trunk rotation with arms outstretched, supine over towel roll Ts and Ys as well as scapular retraction       Patient will benefit from skilled therapeutic intervention in order to improve the following deficits and impairments:     Visit Diagnosis: Aftercare following surgery for neoplasm  Abnormal posture     Problem List Patient Active Problem List   Diagnosis Date Noted  . Breast cancer ([place]) 09/27/2017  . Breast cancer of upper-outer quadrant of right female breast ([place]) 09/27/2017  . Genetic testing 09/07/2017  . Von Willebrand disease ([place]) 08/30/2017  . Malignant neoplasm of upper-outer quadrant of right breast in female, estrogen receptor positive ([place]) 08/20/2017    Donato Heinz. Owens Shark PT  Norwood Levo 11/19/2017, 8:10 AM  [place], [place], [address] Phone: [phone]   Fax:  [phone]  Name: Catherine Robbins MRN: [id number] Date of Birth: [birth year]

## 2017-11-22 ENCOUNTER — Ambulatory Visit: Payer: BC Managed Care – PPO

## 2017-11-22 DIAGNOSIS — Z170 Estrogen receptor positive status [ER+]: Secondary | ICD-10-CM

## 2017-11-22 DIAGNOSIS — Z483 Aftercare following surgery for neoplasm: Secondary | ICD-10-CM

## 2017-11-22 DIAGNOSIS — C50411 Malignant neoplasm of upper-outer quadrant of right female breast: Secondary | ICD-10-CM

## 2017-11-22 DIAGNOSIS — R293 Abnormal posture: Secondary | ICD-10-CM

## 2017-11-22 NOTE — Therapy (Signed)
[place], [place], [address] Phone: [phone]   Fax:  [phone]  Physical Therapy Treatment  Patient Details  Name: Catherine Robbins MRN: [id number] Date of Birth: 02/26/1956 Referring Provider: Dr. Audelia Hives    Encounter Date: 11/22/2017  PT End of Session - 11/22/17 1104    Visit Number  5    Number of Visits  10    Date for PT Re-Evaluation  12/10/17    PT Start Time  1017    PT Stop Time  1104    PT Time Calculation (min)  47 min    Activity Tolerance  Patient tolerated treatment well    Behavior During Therapy  [facility] for tasks assessed/performed       Past Medical History:  Diagnosis Date  . Breast cancer in female [facility])    Right  . Cancer ([place])   . 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 Low sodium levels   . Lyme disease   . Von Willebrand disease ([place])     Past Surgical History:  Procedure Laterality Date  . BREAST LUMPECTOMY Right   . BREAST RECONSTRUCTION WITH PLACEMENT OF TISSUE EXPANDER AND FLEX HD (ACELLULAR HYDRATED DERMIS) Right 09/27/2017   Procedure: RIGHT BREAST RECONSTRUCTION WITH PLACEMENT OF TISSUE EXPANDER AND FLEX HD (ACELLULAR HYDRATED DERMIS);  Surgeon: Wallace Going, DO;  Location: [place];  Service: Plastics;  Laterality: Right;  . MASTECTOMY W/ SENTINEL NODE BIOPSY Right 09/27/2017   Procedure: RIGHT MASTECTOMY WITH SENTINEL LYMPH NODE BIOPSY;  Surgeon: Jovita Kussmaul, MD;  Location: [place];  Service: General;  Laterality: Right;  . TONSILLECTOMY      There were no vitals filed for this visit.  Subjective Assessment - 11/22/17 1019    Subjective  I got a fill in my expander last Thursday so still feeling pretty tight though.    Pertinent History  Patient was diagnosed on 08/11/17 with right grade 2 invasive ductal carcinoma breast cancer. It measures 2.6 cm and 6 mm in 2 areas in the upper outer quadrant. It is ER/PR positive and HER2 negative with a Ki67 of 25%.  She has a history of a right lumpectomy and sentinel node biopsy (0/1 nodes) and radiation in 3/06. Pt underwent mastectomy with immediate expander with one node removed on 09/27/2017. she is getting weekly expander fills and plans to have implant exchange on august 9     Patient Stated Goals  to help with tightness at chest  and learn the exercise program to help decrase lymphedema risk     Currently in Pain?  Yes    Pain Score  3     Pain Location  Breast    Pain Orientation  Right    Pain Descriptors / Indicators  Other (Comment) pulling    Pain Type  Acute pain    Pain Onset  More than a month ago    Pain Frequency  Intermittent    Aggravating Factors   worse right after my fills    Pain Relieving Factors  rest                       [facility] Adult PT Treatment/Exercise - 11/22/17 0001      Manual Therapy   Soft tissue mobilization  --    Myofascial Release  crosshands across right axilla and at varying areas around breast, especially at inferior breast where pt reports shooting pain and tightness  after fill (had calmed down some by today though    Passive ROM  Supine over towel roll at thoracic spine for Rt shoulder flexion, abduction, D2 and horizontal abuction for pect minor stretch all with slow, prolonged holds                  PT Long Term Goals - 11/19/17 0962      PT LONG TERM GOAL #1   Title  Pt will be independent in progressive exercise for UE stretching and strengthening     Time  4    Period  Weeks    Status  On-going      PT LONG TERM GOAL #2   Title  Pt will be independent in lymphedema risk reduction practices     Time  4    Period  Weeks    Status  On-going      PT LONG TERM GOAL #3   Title  Pt will have 150 degrees of painfree right shoulder  abduction so she can return to ususal activities     [facility] Goals - 08/25/17 2019      Patient will be able to verbalize understanding of  pertinent lymphedema risk reduction practices relevant to her diagnosis specifically related to skin care.   Time  1    Period  Days    Status  Achieved      Patient will be able to return demonstrate and/or verbalize understanding of the post-op home exercise program related to regaining shoulder range of motion.   Time  1    Period  Days    Status  Achieved      Patient will be able to verbalize understanding of the importance of attending the postoperative After Breast Cancer Class for further lymphedema risk reduction education and therapeutic exercise.   Time  1    Period  Days    Status  Achieved           Plan - 11/22/17 1217    Clinical Impression Statement  Focused on manual therapy today as pt reports she had another fill last Thursday and is still feeling very tight in Rt breast from that. Pt had noticeable increase in her P/ROM by end of session and was cording was slighty less restricted by end of session as well. Pt had not noticed any difference with wearing TG soft but reports only wore it for 1-2 hours at a time. Suggested she try sleeping in sleeve to see if she gets any further relief from this,     Rehab Potential  Excellent    Clinical Impairments Affecting Rehab Potential  ongoing expander fills     PT Frequency  2x / week    PT Duration  4 weeks    PT Treatment/Interventions  ADLs/Self Care Home Management;Therapeutic exercise;Patient/family education;Manual techniques;Passive range of motion;Scar mobilization;Manual lymph drainage;Therapeutic activities;DME Instruction;Orthotic Fit/Training    PT Next Visit Plan  measure right shoulder ROM for goal assessment and review lymphedema risk reduction practices, teach Strength ABC program , continue manual work for stretching and to work on cordingas needed ; progress HEP prn;     Consulted and Agree with Plan of Care  Patient       Patient will benefit from skilled therapeutic intervention in order to improve the  following deficits and impairments:  Decreased range of motion, Decreased knowledge of precautions, Impaired  UE functional use, Postural dysfunction, Pain, Decreased knowledge of use of DME, Increased fascial restricitons  Visit Diagnosis: Aftercare following surgery for neoplasm  Abnormal posture  Malignant neoplasm of upper-outer quadrant of right breast in female, estrogen receptor positive ([place])     Problem List Patient Active Problem List   Diagnosis Date Noted  . Breast cancer ([place]) 09/27/2017  . Breast cancer of upper-outer quadrant of right female breast ([place]) 09/27/2017  . Genetic testing 09/07/2017  . Von Willebrand disease ([place]) 08/30/2017  . Malignant neoplasm of upper-outer quadrant of right breast in female, estrogen receptor positive ([place]) 08/20/2017    Otelia Limes, PTA 11/22/2017, 12:25 PM  [place], [place], [address] Phone: [phone]   Fax:  [phone]  Name: ATHALEE ESTERLINE MRN: [id number] Date of Birth: 10-Sep-1955

## 2017-11-24 ENCOUNTER — Ambulatory Visit: Payer: BC Managed Care – PPO | Admitting: Physical Therapy

## 2017-11-24 DIAGNOSIS — Z483 Aftercare following surgery for neoplasm: Secondary | ICD-10-CM

## 2017-11-24 DIAGNOSIS — R293 Abnormal posture: Secondary | ICD-10-CM

## 2017-11-24 NOTE — Therapy (Signed)
[place], [place], [address] Phone: [phone]   Fax:  [phone]  Physical Therapy Treatment  Patient Details  Name: Catherine Robbins MRN: [id number] Date of Birth: Apr 26, 1956 Referring Provider: Dr. Audelia Hives    Encounter Date: 11/24/2017  PT End of Session - 11/24/17 1245    Visit Number  6    Number of Visits  10    Date for PT Re-Evaluation  12/10/17    PT Start Time  0847    PT Stop Time  0933    PT Time Calculation (min)  46 min    Activity Tolerance  Patient tolerated treatment well    Behavior During Therapy  [facility] for tasks assessed/performed       Past Medical History:  Diagnosis Date  . Breast cancer in female [facility])    Right  . Cancer ([place])   . 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 Low sodium levels   . Lyme disease   . Von Willebrand disease ([place])     Past Surgical History:  Procedure Laterality Date  . BREAST LUMPECTOMY Right   . BREAST RECONSTRUCTION WITH PLACEMENT OF TISSUE EXPANDER AND FLEX HD (ACELLULAR HYDRATED DERMIS) Right 09/27/2017   Procedure: RIGHT BREAST RECONSTRUCTION WITH PLACEMENT OF TISSUE EXPANDER AND FLEX HD (ACELLULAR HYDRATED DERMIS);  Surgeon: Wallace Going, DO;  Location: [place];  Service: Plastics;  Laterality: Right;  . MASTECTOMY W/ SENTINEL NODE BIOPSY Right 09/27/2017   Procedure: RIGHT MASTECTOMY WITH SENTINEL LYMPH NODE BIOPSY;  Surgeon: Jovita Kussmaul, MD;  Location: [facility] [place];  Service: General;  Laterality: Right;  . TONSILLECTOMY      There were no vitals filed for this visit.  Subjective Assessment - 11/24/17 0851    Subjective  I came prepared to learn some exercise today. The last therapy session made me pretty uncomfortable. It feels like a tennis ball is getting ready to bust out.    Pertinent History  Patient was diagnosed on 08/11/17 with right grade 2 invasive ductal carcinoma breast cancer. It measures 2.6 cm and 6 mm in 2 areas in the  upper outer quadrant. It is ER/PR positive and HER2 negative with a Ki67 of 25%. She has a history of a right lumpectomy and sentinel node biopsy (0/1 nodes) and radiation in 3/06. Pt underwent mastectomy with immediate expander with one node removed on 09/27/2017. she is getting weekly expander fills and plans to have implant exchange on august 9     Patient Stated Goals  to help with tightness at chest  and learn the exercise program to help decrase lymphedema risk     Currently in Pain?  Yes    Pain Score  5     Pain Location  Breast    Pain Orientation  Right    Pain Descriptors / Indicators  Other (Comment) like a tennis ball is about to bust out, like something's clamped to my chest    Aggravating Factors   last therapy session    Pain Relieving Factors  nothing                       [facility] Adult PT Treatment/Exercise - 11/24/17 0001      Exercises   Exercises  Other Exercises    Other Exercises   Instructed patient in all of strength ABC program, with patient performing all stretches, core strengthening, and resistance exercises with limited time/repetitions in the  interest of expediency today. She did most of resistance exercises with 1 lb. weights except squats and step-ups.             PT Education - 11/24/17 1245    Education provided  Yes    Education Details  strength ABC program    Person(s) Educated  Patient    Methods  Explanation;Demonstration;Verbal cues;Handout    Comprehension  Verbalized understanding;Returned demonstration          PT Long Term Goals - 11/19/17 8099      PT LONG TERM GOAL #1   Title  Pt will be independent in progressive exercise for UE stretching and strengthening     Time  4    Period  Weeks    Status  On-going      PT LONG TERM GOAL #2   Title  Pt will be independent in lymphedema risk reduction practices     Time  4    Period  Weeks    Status  On-going      PT LONG TERM GOAL #3   Title  Pt will have 150  degrees of painfree right shoulder  abduction so she can return to ususal activities     [facility] Goals - 08/25/17 2019      Patient will be able to verbalize understanding of pertinent lymphedema risk reduction practices relevant to her diagnosis specifically related to skin care.   Time  1    Period  Days    Status  Achieved      Patient will be able to return demonstrate and/or verbalize understanding of the post-op home exercise program related to regaining shoulder range of motion.   Time  1    Period  Days    Status  Achieved      Patient will be able to verbalize understanding of the importance of attending the postoperative After Breast Cancer Class for further lymphedema risk reduction education and therapeutic exercise.   Time  1    Period  Days    Status  Achieved           Plan - 11/24/17 1246    Clinical Impression Statement  Pt. came in today expecting to learn strength ABC program, so that is what was done. She did well with it, having done many of these exercises in group gym setting before. Pt. expresses some desire to finish up with therapy, though she is willing to do what is suggested.    Rehab Potential  Excellent    Clinical Impairments Affecting Rehab Potential  ongoing expander fills     PT Frequency  2x / week    PT Duration  4 weeks    PT Treatment/Interventions  ADLs/Self Care Home Management;Therapeutic exercise;Patient/family education;Manual techniques;Passive range of motion;Scar mobilization;Manual lymph drainage;Therapeutic activities;DME Instruction;Orthotic Fit/Training    PT Next Visit Plan  Anticipate discharge at next visit. Measure right shoulder ROM for goal assessment. Go over lymphedema risk reduction practices. Answer questions about strength ABC.    PT Home Exercise Plan  supine dowel, hooklying lower trunk rotation with arms outstretched, supine over towel roll Ts and Ys as well as scapular  retraction    Consulted and Agree with Plan of Care  Patient       Patient will benefit from skilled therapeutic intervention in order to improve the following deficits and impairments:  Decreased range of  motion, Decreased knowledge of precautions, Impaired UE functional use, Postural dysfunction, Pain, Decreased knowledge of use of DME, Increased fascial restricitons  Visit Diagnosis: Aftercare following surgery for neoplasm  Abnormal posture     Problem List Patient Active Problem List   Diagnosis Date Noted  . Breast cancer ([place]) 09/27/2017  . Breast cancer of upper-outer quadrant of right female breast ([place]) 09/27/2017  . Genetic testing 09/07/2017  . Von Willebrand disease ([place]) 08/30/2017  . Malignant neoplasm of upper-outer quadrant of right breast in female, estrogen receptor positive ([place]) 08/20/2017    [place] 11/24/2017, 12:48 PM  [place], [place], [address] Phone: [phone]   Fax:  [phone]  Name: Catherine Robbins MRN: [id number] Date of Birth: 01-Jan-1956  Serafina Royals, PT 11/24/17 12:48 PM

## 2017-11-29 ENCOUNTER — Ambulatory Visit: Payer: BC Managed Care – PPO | Attending: General Surgery | Admitting: Physical Therapy

## 2017-11-29 ENCOUNTER — Encounter: Payer: Self-pay | Admitting: Physical Therapy

## 2017-11-29 DIAGNOSIS — R293 Abnormal posture: Secondary | ICD-10-CM | POA: Diagnosis present

## 2017-11-29 DIAGNOSIS — Z483 Aftercare following surgery for neoplasm: Secondary | ICD-10-CM

## 2017-11-29 NOTE — Therapy (Signed)
[place], [place], [address] Phone: [phone]   Fax:  [phone]  Physical Therapy Treatment  Patient Details  Name: Catherine Robbins MRN: [id number] Date of Birth: 1955-08-30 Referring Provider: Dr. Audelia Hives    Encounter Date: 11/29/2017  PT End of Session - 11/29/17 1011    Visit Number  7    Number of Visits  10    Date for PT Re-Evaluation  12/10/17    PT Start Time  0931    PT Stop Time  1010    PT Time Calculation (min)  39 min    Activity Tolerance  Patient tolerated treatment well    Behavior During Therapy  [facility] for tasks assessed/performed       Past Medical History:  Diagnosis Date  . Breast cancer in female [facility])    Right  . Cancer ([place])   . 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 Low sodium levels   . Lyme disease   . Von Willebrand disease ([place])     Past Surgical History:  Procedure Laterality Date  . BREAST LUMPECTOMY Right   . BREAST RECONSTRUCTION WITH PLACEMENT OF TISSUE EXPANDER AND FLEX HD (ACELLULAR HYDRATED DERMIS) Right 09/27/2017   Procedure: RIGHT BREAST RECONSTRUCTION WITH PLACEMENT OF TISSUE EXPANDER AND FLEX HD (ACELLULAR HYDRATED DERMIS);  Surgeon: Wallace Going, DO;  Location: [place];  Service: Plastics;  Laterality: Right;  . MASTECTOMY W/ SENTINEL NODE BIOPSY Right 09/27/2017   Procedure: RIGHT MASTECTOMY WITH SENTINEL LYMPH NODE BIOPSY;  Surgeon: Jovita Kussmaul, MD;  Location: [place];  Service: General;  Laterality: Right;  . TONSILLECTOMY      There were no vitals filed for this visit.  Subjective Assessment - 11/29/17 0934    Subjective  I did 3 sets of 10 of all the exercises in the Strength ABC packet. I had my expanders filled Friday and that put me down a couple of days. I was in the best shape of my life before this.     Pertinent History  Patient was diagnosed on 08/11/17 with right grade 2 invasive ductal carcinoma breast cancer. It measures 2.6 cm  and 6 mm in 2 areas in the upper outer quadrant. It is ER/PR positive and HER2 negative with a Ki67 of 25%. She has a history of a right lumpectomy and sentinel node biopsy (0/1 nodes) and radiation in 3/06. Pt underwent mastectomy with immediate expander with one node removed on 09/27/2017. she is getting weekly expander fills and plans to have implant exchange on august 9     Patient Stated Goals  to help with tightness at chest  and learn the exercise program to help decrase lymphedema risk     Currently in Pain?  Yes    Pain Score  5     Pain Location  Breast    Pain Orientation  Right;Lower         [facility] PT Assessment - 11/29/17 0001      AROM   Right Shoulder Flexion  155 Degrees    Right Shoulder ABduction  170 Degrees                   [facility] Adult PT Treatment/Exercise - 11/29/17 0001      Exercises   Exercises  Other Exercises    Other Exercises   Reviewed clam shells from Strength ABC program and added green band to these- educated pt extensively on progression of  exercise program - see assessment      Manual Therapy   Manual Therapy  Edema management    Edema Management  gave pt Rx to get signed for prophylactic sleeve and gaunlet and instructed pt in where to go to get measured                  PT Long Term Goals - 11/29/17 0937      PT LONG TERM GOAL #1   Title  Pt will be independent in progressive exercise for UE stretching and strengthening     Baseline  11/29/17- pt reports she is independent in home exercise program and did 3 sets of 10 at home    Time  4    Period  Weeks    Status  Achieved      PT LONG TERM GOAL #2   Title  Pt will be independent in lymphedema risk reduction practices     Baseline  11/29/17- pt educated on this today    Time  4    Period  Weeks    Status  Achieved      PT LONG TERM GOAL #3   Title  Pt will have 150 degrees of painfree right shoulder  abduction so she can return to ususal activities     Baseline   11/29/17- 170 with some discomfort but pt states she will continue stretching at home    Time  [facility] Goals - 08/25/17 2019      Patient will be able to verbalize understanding of pertinent lymphedema risk reduction practices relevant to her diagnosis specifically related to skin care.   Time  1    Period  Days    Status  Achieved      Patient will be able to return demonstrate and/or verbalize understanding of the post-op home exercise program related to regaining shoulder range of motion.   Time  1    Period  Days    Status  Achieved      Patient will be able to verbalize understanding of the importance of attending the postoperative After Breast Cancer Class for further lymphedema risk reduction education and therapeutic exercise.   Time  1    Period  Days    Status  Achieved           Plan - 11/29/17 1012    Clinical Impression Statement  Assessed pt's progress towards goals in therapy. She has now met all goals for therapy. Her ROM has improved greatly since time of evaluation. She is independent in Strength ABC program and today spent time educating pt on proper way to progress exercises to decrease risk of lymphedema. She was able to do 3 sets of 10 reps of all exercises with 1 lb weights. Educated pt that she could increase in 2 lb weights. Educated pt on lymphedema risk reduction practices and how to obtain a prophylactic compression sleeve and gauntlet for flying. Encouraged pt to continue stretching at home for pecs especially lying of foam roll with arms outstretched. Pt has several questions about what exercises she can perform after her reconstructive surgery and educated pt to ask her plastic surgeon about this since restrictions vary between doctors. Pt is now ready for discahrge at this time.     Rehab Potential  Excellent    Clinical Impairments Affecting Rehab Potential  ongoing expander  fills     PT Frequency  2x /  week    PT Duration  4 weeks    PT Treatment/Interventions  ADLs/Self Care Home Management;Therapeutic exercise;Patient/family education;Manual techniques;Passive range of motion;Scar mobilization;Manual lymph drainage;Therapeutic activities;DME Instruction;Orthotic Fit/Training    PT Next Visit Plan  d/c this visit    PT Home Exercise Plan  supine dowel, hooklying lower trunk rotation with arms outstretched, supine over towel roll Ts and Ys as well as scapular retraction    Consulted and Agree with Plan of Care  Patient       Patient will benefit from skilled therapeutic intervention in order to improve the following deficits and impairments:  Decreased range of motion, Decreased knowledge of precautions, Impaired UE functional use, Postural dysfunction, Pain, Decreased knowledge of use of DME, Increased fascial restricitons  Visit Diagnosis: Aftercare following surgery for neoplasm  Abnormal posture     Problem List Patient Active Problem List   Diagnosis Date Noted  . Breast cancer ([place]) 09/27/2017  . Breast cancer of upper-outer quadrant of right female breast ([place]) 09/27/2017  . Genetic testing 09/07/2017  . Von Willebrand disease ([employer]) 08/30/2017  . Malignant neoplasm of upper-outer quadrant of right breast in female, estrogen receptor positive ([place]) 08/20/2017    Allyson Sabal [facility] 11/29/2017, 10:16 AM  [place], [place], [address] Phone: [phone]   Fax:  [phone]  Name: ALLICIA CULLEY MRN: [id number] Date of Birth: [birth year]  PHYSICAL THERAPY DISCHARGE SUMMARY  Visits from Start of Care: 7  Current functional level related to goals / functional outcomes: See above- all goals met   Remaining deficits: Still some tightness in right pec but this is to be expected since pt still has expanders that are getting filled   Education / Equipment: HEP, lymphedema risk reduction  practices, obtaining prophylactic compression garments  Plan: Patient agrees to discharge.  Patient goals were met. Patient is being discharged due to meeting the stated rehab goals.  ?????    Allyson Sabal [place], [place] 11/29/17 10:17 AM

## 2017-12-30 ENCOUNTER — Encounter (HOSPITAL_BASED_OUTPATIENT_CLINIC_OR_DEPARTMENT_OTHER): Payer: Self-pay | Admitting: *Deleted

## 2017-12-30 ENCOUNTER — Other Ambulatory Visit: Payer: Self-pay

## 2017-12-31 ENCOUNTER — Encounter (HOSPITAL_BASED_OUTPATIENT_CLINIC_OR_DEPARTMENT_OTHER)
Admission: RE | Admit: 2017-12-31 | Discharge: 2017-12-31 | Disposition: A | Discharge status: Home / Self Care | Payer: BC Managed Care – PPO | Source: Ambulatory Visit | Attending: Plastic Surgery | Admitting: Plastic Surgery

## 2017-12-31 DIAGNOSIS — R001 Bradycardia, unspecified: Secondary | ICD-10-CM | POA: Insufficient documentation

## 2017-12-31 DIAGNOSIS — I10 Essential (primary) hypertension: Secondary | ICD-10-CM | POA: Diagnosis not present

## 2017-12-31 DIAGNOSIS — Z01818 Encounter for other preprocedural examination: Secondary | ICD-10-CM | POA: Diagnosis present

## 2018-01-04 ENCOUNTER — Ambulatory Visit: Payer: Self-pay | Admitting: Plastic Surgery

## 2018-01-04 DIAGNOSIS — Z9011 Acquired absence of right breast and nipple: Secondary | ICD-10-CM

## 2018-01-04 DIAGNOSIS — N651 Disproportion of reconstructed breast: Secondary | ICD-10-CM

## 2018-01-07 ENCOUNTER — Ambulatory Visit (HOSPITAL_BASED_OUTPATIENT_CLINIC_OR_DEPARTMENT_OTHER): Payer: BC Managed Care – PPO | Admitting: Anesthesiology

## 2018-01-07 ENCOUNTER — Ambulatory Visit (HOSPITAL_BASED_OUTPATIENT_CLINIC_OR_DEPARTMENT_OTHER)
Admission: RE | Admit: 2018-01-07 | Discharge: 2018-01-07 | Disposition: A | Discharge status: Home / Self Care | Payer: BC Managed Care – PPO | Source: Ambulatory Visit | Attending: Plastic Surgery | Admitting: Plastic Surgery

## 2018-01-07 ENCOUNTER — Encounter (HOSPITAL_BASED_OUTPATIENT_CLINIC_OR_DEPARTMENT_OTHER): Payer: Self-pay

## 2018-01-07 ENCOUNTER — Other Ambulatory Visit: Payer: Self-pay

## 2018-01-07 ENCOUNTER — Encounter (HOSPITAL_BASED_OUTPATIENT_CLINIC_OR_DEPARTMENT_OTHER)
Admission: RE | Disposition: A | Discharge status: Home / Self Care | Payer: Self-pay | Source: Ambulatory Visit | Attending: Plastic Surgery

## 2018-01-07 DIAGNOSIS — D680 Von Willebrand's disease: Secondary | ICD-10-CM | POA: Diagnosis not present

## 2018-01-07 DIAGNOSIS — Z87891 Personal history of nicotine dependence: Secondary | ICD-10-CM | POA: Diagnosis not present

## 2018-01-07 DIAGNOSIS — N6489 Other specified disorders of breast: Secondary | ICD-10-CM | POA: Diagnosis present

## 2018-01-07 DIAGNOSIS — Z9011 Acquired absence of right breast and nipple: Secondary | ICD-10-CM | POA: Diagnosis not present

## 2018-01-07 DIAGNOSIS — Z888 Allergy status to other drugs, medicaments and biological substances status: Secondary | ICD-10-CM | POA: Diagnosis not present

## 2018-01-07 DIAGNOSIS — N651 Disproportion of reconstructed breast: Secondary | ICD-10-CM

## 2018-01-07 DIAGNOSIS — Z853 Personal history of malignant neoplasm of breast: Secondary | ICD-10-CM | POA: Insufficient documentation

## 2018-01-07 HISTORY — PX: BREAST REDUCTION SURGERY: SHX8

## 2018-01-07 HISTORY — PX: REMOVAL OF TISSUE EXPANDER AND PLACEMENT OF IMPLANT: SHX6457

## 2018-01-07 SURGERY — REMOVAL, TISSUE EXPANDER, BREAST, WITH IMPLANT INSERTION
Anesthesia: General | Site: Breast | Laterality: Right

## 2018-01-07 MED ORDER — EPHEDRINE SULFATE 50 MG/ML IJ SOLN
INTRAMUSCULAR | Status: DC | PRN
  Administered 2018-01-07: 10 mg via INTRAVENOUS
  Administered 2018-01-07: 15 mg via INTRAVENOUS

## 2018-01-07 MED ORDER — PROPOFOL 10 MG/ML IV BOLUS
INTRAVENOUS | Status: DC | PRN
  Administered 2018-01-07: 200 mg via INTRAVENOUS

## 2018-01-07 MED ORDER — MIDAZOLAM HCL 2 MG/2ML IJ SOLN
INTRAMUSCULAR | Status: AC
  Filled 2018-01-07: qty 2

## 2018-01-07 MED ORDER — ACETAMINOPHEN 10 MG/ML IV SOLN
INTRAVENOUS | Status: AC
  Filled 2018-01-07: qty 100

## 2018-01-07 MED ORDER — EPINEPHRINE PF 1 MG/ML IJ SOLN
INTRAMUSCULAR | Status: DC | PRN
  Administered 2018-01-07: 1 mg via SUBCUTANEOUS

## 2018-01-07 MED ORDER — FENTANYL CITRATE (PF) 100 MCG/2ML IJ SOLN
INTRAMUSCULAR | Status: AC
  Filled 2018-01-07: qty 2

## 2018-01-07 MED ORDER — OXYCODONE HCL 5 MG PO TABS
5.0000 mg | ORAL_TABLET | Freq: Once | ORAL | Status: AC | PRN
  Administered 2018-01-07: 5 mg via ORAL

## 2018-01-07 MED ORDER — LACTATED RINGERS IV SOLN
INTRAVENOUS | Status: DC
  Administered 2018-01-07 (×2): via INTRAVENOUS

## 2018-01-07 MED ORDER — CEFAZOLIN SODIUM-DEXTROSE 2-4 GM/100ML-% IV SOLN
INTRAVENOUS | Status: AC
Start: 2018-01-07 — End: ?
  Filled 2018-01-07: qty 100

## 2018-01-07 MED ORDER — MIDAZOLAM HCL 5 MG/5ML IJ SOLN
INTRAMUSCULAR | Status: DC | PRN
  Administered 2018-01-07: 2 mg via INTRAVENOUS

## 2018-01-07 MED ORDER — OXYCODONE HCL 5 MG/5ML PO SOLN
5.0000 mg | Freq: Once | ORAL | Status: AC | PRN
Start: 2018-01-07 — End: 2018-01-07

## 2018-01-07 MED ORDER — ONDANSETRON HCL 4 MG/2ML IJ SOLN
INTRAMUSCULAR | Status: AC
  Filled 2018-01-07: qty 2

## 2018-01-07 MED ORDER — SCOPOLAMINE 1 MG/3DAYS TD PT72: 1.0000 | MEDICATED_PATCH | Freq: Once | TRANSDERMAL | Status: DC | PRN

## 2018-01-07 MED ORDER — FENTANYL CITRATE (PF) 100 MCG/2ML IJ SOLN: 50.0000 ug | INTRAMUSCULAR | Status: DC | PRN

## 2018-01-07 MED ORDER — CEFAZOLIN SODIUM-DEXTROSE 2-4 GM/100ML-% IV SOLN
2.0000 g | INTRAVENOUS | Status: AC
  Administered 2018-01-07: 2 g via INTRAVENOUS

## 2018-01-07 MED ORDER — LIDOCAINE 2% (20 MG/ML) 5 ML SYRINGE
INTRAMUSCULAR | Status: AC
  Filled 2018-01-07: qty 5

## 2018-01-07 MED ORDER — LACTATED RINGERS IV SOLN
INTRAVENOUS | Status: AC | PRN
  Administered 2018-01-07: 1000 mL via INTRAVENOUS

## 2018-01-07 MED ORDER — EPHEDRINE SULFATE 50 MG/ML IJ SOLN
INTRAMUSCULAR | Status: AC
  Filled 2018-01-07: qty 1

## 2018-01-07 MED ORDER — ACETAMINOPHEN 10 MG/ML IV SOLN
1000.0000 mg | Freq: Once | INTRAVENOUS | Status: AC
  Administered 2018-01-07: 1000 mg via INTRAVENOUS

## 2018-01-07 MED ORDER — KETOROLAC TROMETHAMINE 30 MG/ML IJ SOLN
INTRAMUSCULAR | Status: AC
  Filled 2018-01-07: qty 1

## 2018-01-07 MED ORDER — DEXAMETHASONE SODIUM PHOSPHATE 4 MG/ML IJ SOLN
INTRAMUSCULAR | Status: DC | PRN
  Administered 2018-01-07: 5 mg via INTRAVENOUS

## 2018-01-07 MED ORDER — BUPIVACAINE-EPINEPHRINE 0.25% -1:200000 IJ SOLN
INTRAMUSCULAR | Status: DC | PRN
  Administered 2018-01-07: 21 mL

## 2018-01-07 MED ORDER — LIDOCAINE HCL (CARDIAC) PF 100 MG/5ML IV SOSY
PREFILLED_SYRINGE | INTRAVENOUS | Status: DC | PRN
  Administered 2018-01-07: 50 mg via INTRAVENOUS

## 2018-01-07 MED ORDER — OXYCODONE HCL 5 MG PO TABS
ORAL_TABLET | ORAL | Status: AC
  Filled 2018-01-07: qty 1

## 2018-01-07 MED ORDER — FENTANYL CITRATE (PF) 100 MCG/2ML IJ SOLN
INTRAMUSCULAR | Status: DC | PRN
  Administered 2018-01-07 (×4): 50 ug via INTRAVENOUS

## 2018-01-07 MED ORDER — FENTANYL CITRATE (PF) 100 MCG/2ML IJ SOLN
25.0000 ug | INTRAMUSCULAR | Status: DC | PRN
  Administered 2018-01-07: 50 ug via INTRAVENOUS
  Administered 2018-01-07 (×2): 25 ug via INTRAVENOUS

## 2018-01-07 MED ORDER — KETOROLAC TROMETHAMINE 30 MG/ML IJ SOLN
30.0000 mg | Freq: Once | INTRAMUSCULAR | Status: AC
  Administered 2018-01-07: 30 mg via INTRAVENOUS

## 2018-01-07 MED ORDER — POLYMYXIN B SULFATE 500000 UNITS IJ SOLR
INTRAMUSCULAR | Status: DC | PRN
  Administered 2018-01-07: 13:00:00

## 2018-01-07 MED ORDER — PROPOFOL 10 MG/ML IV BOLUS
INTRAVENOUS | Status: AC
  Filled 2018-01-07: qty 20

## 2018-01-07 MED ORDER — MIDAZOLAM HCL 2 MG/2ML IJ SOLN: 1.0000 mg | INTRAMUSCULAR | Status: DC | PRN

## 2018-01-07 MED ORDER — DEXAMETHASONE SODIUM PHOSPHATE 10 MG/ML IJ SOLN
INTRAMUSCULAR | Status: AC
  Filled 2018-01-07: qty 1

## 2018-01-07 MED ORDER — ONDANSETRON HCL 4 MG/2ML IJ SOLN
INTRAMUSCULAR | Status: DC | PRN
  Administered 2018-01-07: 4 mg via INTRAVENOUS

## 2018-01-07 SURGICAL SUPPLY — 79 items
ADH SKN CLS APL DERMABOND .7 (GAUZE/BANDAGES/DRESSINGS)
BAG DECANTER FOR FLEXI CONT (MISCELLANEOUS) ×4 IMPLANT
BINDER BREAST LRG (GAUZE/BANDAGES/DRESSINGS) IMPLANT
BINDER BREAST MEDIUM (GAUZE/BANDAGES/DRESSINGS) IMPLANT
BINDER BREAST XLRG (GAUZE/BANDAGES/DRESSINGS) IMPLANT
BINDER BREAST XXLRG (GAUZE/BANDAGES/DRESSINGS) IMPLANT
BIOPATCH RED 1 DISK 7.0 (GAUZE/BANDAGES/DRESSINGS) IMPLANT
BIOPATCH RED 1IN DISK 7.0MM (GAUZE/BANDAGES/DRESSINGS)
BLADE HEX COATED 2.75 (ELECTRODE) ×4 IMPLANT
BLADE KNIFE PERSONA 10 (BLADE) ×8 IMPLANT
BLADE SURG 15 STRL LF DISP TIS (BLADE) ×2 IMPLANT
BLADE SURG 15 STRL SS (BLADE) ×4
BNDG GAUZE ELAST 4 BULKY (GAUZE/BANDAGES/DRESSINGS) ×4 IMPLANT
CANISTER SUCT 1200ML W/VALVE (MISCELLANEOUS) ×4 IMPLANT
CHLORAPREP W/TINT 26ML (MISCELLANEOUS) ×4 IMPLANT
COVER BACK TABLE 60X90IN (DRAPES) ×4 IMPLANT
COVER MAYO STAND STRL (DRAPES) ×4 IMPLANT
DECANTER SPIKE VIAL GLASS SM (MISCELLANEOUS) IMPLANT
DERMABOND ADVANCED (GAUZE/BANDAGES/DRESSINGS)
DERMABOND ADVANCED .7 DNX12 (GAUZE/BANDAGES/DRESSINGS) IMPLANT
DRAIN CHANNEL 19F RND (DRAIN) IMPLANT
DRAPE LAPAROSCOPIC ABDOMINAL (DRAPES) ×4 IMPLANT
DRSG PAD ABDOMINAL 8X10 ST (GAUZE/BANDAGES/DRESSINGS) ×8 IMPLANT
ELECT BLADE 4.0 EZ CLEAN MEGAD (MISCELLANEOUS) ×4
ELECT COATED BLADE 2.86 ST (ELECTRODE) ×4 IMPLANT
ELECT REM PT RETURN 9FT ADLT (ELECTROSURGICAL) ×4
ELECTRODE BLDE 4.0 EZ CLN MEGD (MISCELLANEOUS) ×2 IMPLANT
ELECTRODE REM PT RTRN 9FT ADLT (ELECTROSURGICAL) ×2 IMPLANT
EVACUATOR SILICONE 100CC (DRAIN) IMPLANT
GAUZE SPONGE 4X4 12PLY STRL LF (GAUZE/BANDAGES/DRESSINGS) ×4 IMPLANT
GLOVE BIO SURGEON STRL SZ 6.5 (GLOVE) ×10 IMPLANT
GLOVE BIO SURGEON STRL SZ7 (GLOVE) ×2 IMPLANT
GLOVE BIO SURGEONS STRL SZ 6.5 (GLOVE) ×4
GLOVE BIOGEL PI IND STRL 8 (GLOVE) IMPLANT
GLOVE BIOGEL PI INDICATOR 8 (GLOVE) ×2
GOWN STRL REUS W/ TWL LRG LVL3 (GOWN DISPOSABLE) ×6 IMPLANT
GOWN STRL REUS W/TWL LRG LVL3 (GOWN DISPOSABLE) ×12
IMPL GEL 500CC (Breast) IMPLANT
IMPLANT GEL 500CC (Breast) ×4 IMPLANT
IV NS 1000ML (IV SOLUTION)
IV NS 1000ML BAXH (IV SOLUTION) IMPLANT
NDL HYPO 25X1 1.5 SAFETY (NEEDLE) ×2 IMPLANT
NDL SAFETY ECLIPSE 18X1.5 (NEEDLE) ×2 IMPLANT
NEEDLE HYPO 18GX1.5 SHARP (NEEDLE) ×8
NEEDLE HYPO 25X1 1.5 SAFETY (NEEDLE) ×4 IMPLANT
NS IRRIG 1000ML POUR BTL (IV SOLUTION) IMPLANT
PACK BASIN DAY SURGERY FS (CUSTOM PROCEDURE TRAY) ×4 IMPLANT
PAD ALCOHOL SWAB (MISCELLANEOUS) ×8 IMPLANT
PENCIL BUTTON HOLSTER BLD 10FT (ELECTRODE) ×4 IMPLANT
PIN SAFETY STERILE (MISCELLANEOUS) IMPLANT
SIZER BREAST HP REUSE 500CC (SIZER) ×4
SIZER BRST HP REUSE 500CC (SIZER) IMPLANT
SLEEVE SCD COMPRESS KNEE MED (MISCELLANEOUS) ×4 IMPLANT
SPONGE LAP 18X18 RF (DISPOSABLE) ×8 IMPLANT
STRIP SUTURE WOUND CLOSURE 1/2 (SUTURE) ×8 IMPLANT
SUT MNCRL AB 3-0 PS2 18 (SUTURE) IMPLANT
SUT MNCRL AB 4-0 PS2 18 (SUTURE) ×18 IMPLANT
SUT MON AB 3-0 SH 27 (SUTURE) ×4
SUT MON AB 3-0 SH27 (SUTURE) ×2 IMPLANT
SUT MON AB 5-0 PS2 18 (SUTURE) ×10 IMPLANT
SUT PDS 3-0 CT2 (SUTURE)
SUT PDS AB 2-0 CT2 27 (SUTURE) IMPLANT
SUT PDS II 3-0 CT2 27 ABS (SUTURE) IMPLANT
SUT SILK 3 0 PS 1 (SUTURE) IMPLANT
SUT VIC AB 3-0 SH 27 (SUTURE) ×4
SUT VIC AB 3-0 SH 27X BRD (SUTURE) ×2 IMPLANT
SUT VICRYL 4-0 PS2 18IN ABS (SUTURE) ×4 IMPLANT
SYR 3ML 23GX1 SAFETY (SYRINGE) ×4 IMPLANT
SYR 50ML LL SCALE MARK (SYRINGE) ×2 IMPLANT
SYR BULB IRRIGATION 50ML (SYRINGE) ×4 IMPLANT
SYR CONTROL 10ML LL (SYRINGE) ×4 IMPLANT
TAPE MEASURE VINYL STERILE (MISCELLANEOUS) ×4 IMPLANT
TOWEL GREEN STERILE FF (TOWEL DISPOSABLE) ×8 IMPLANT
TUBE CONNECTING 20'X1/4 (TUBING) ×1
TUBE CONNECTING 20X1/4 (TUBING) ×3 IMPLANT
TUBING INFILTRATION IT-10001 (TUBING) IMPLANT
TUBING SET GRADUATE ASPIR 12FT (MISCELLANEOUS) ×2 IMPLANT
UNDERPAD 30X30 (UNDERPADS AND DIAPERS) ×8 IMPLANT
YANKAUER SUCT BULB TIP NO VENT (SUCTIONS) ×4 IMPLANT

## 2018-01-07 NOTE — H&P (Signed)
Catherine Robbins is an 62 y.o. female.   Chief Complaint: acquired absence of right breast HPI: The patient is a 62 y.o. wf here right breast reconstruction after placement of TE/ADM on 09/27/17. She has a sensitivity to chlorhexidine prep.  Current volume is 460/535 cc. She underwent a right breast lumpectomy in 2006 for an invasive ductal carcinoma (ER / PR positive, HER-2 negaive).  She had adjuvant radiation and took tamoxifen for one month.  She took the Exemestane for 5 years.  On a screening mammogram 08/11/2017 there was an abnormality in the right breast.  Ultrasound and biopsy showed new 2.6 cm irregular mass at the 11 o'clock right upper outer quadrant and right upper outer quadrant. She was found to have invasive ductal carcinoma, grade II.  ER / PR positive with Ki67 25%, HER2 not amplifies.  She is 5 feet 10 inches tall, weight is 180 pounds.  Preop bra is 38 C.  She would like to be around the same size but is ok with going smaller.  Genetics were negative.    Past Medical History:  Diagnosis Date  . Breast cancer in female [facility])    Right  . Cancer ([place])   . 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 Low sodium levels   . Lyme disease   . Von Willebrand disease ([place])     Past Surgical History:  Procedure Laterality Date  . BREAST LUMPECTOMY Right   . BREAST RECONSTRUCTION WITH PLACEMENT OF TISSUE EXPANDER AND FLEX HD (ACELLULAR HYDRATED DERMIS) Right 09/27/2017   Procedure: RIGHT BREAST RECONSTRUCTION WITH PLACEMENT OF TISSUE EXPANDER AND FLEX HD (ACELLULAR HYDRATED DERMIS);  Surgeon: Wallace Going, DO;  Location: [place];  Service: Plastics;  Laterality: Right;  . MASTECTOMY W/ SENTINEL NODE BIOPSY Right 09/27/2017   Procedure: RIGHT MASTECTOMY WITH SENTINEL LYMPH NODE BIOPSY;  Surgeon: Jovita Kussmaul, MD;  Location: [place];  Service: General;  Laterality: Right;  . TONSILLECTOMY      Family History  Problem Relation Age of Onset  . DES usage Mother   . Other Brother        DES exposure   . Heart disease Maternal Grandfather        d. 79  . Other Sister        DES exposure   Social History:  reports that she has quit smoking. She has never used smokeless tobacco. She reports that she drinks alcohol. She reports that she does not use drugs.  Allergies:  Allergies  Allergen Reactions  . Bupropion Other (See Comments)    DIZZINESS UNABLE TO STAND  . Chlorhexidine Gluconate  [Chlorhexidine] Rash    Developed rash over the chest, neck and underarms where was prepped with Chlorhexidine in the OR  . Tamoxifen Other (See Comments)    EXTREME FATIGUE HOT FLASHES SWEATING    No medications prior to admission.    No results found for this or any previous visit (from the past 48 hour(s)). No results found.  Review of Systems  Constitutional: Negative.   HENT: Negative.   Eyes: Negative.   Respiratory: Negative.   Cardiovascular: Negative.   Gastrointestinal: Negative.   Genitourinary: Negative.   Musculoskeletal: Negative.   Skin: Negative.   Neurological: Negative.   Psychiatric/Behavioral: Negative.     Height _0  (1.778 m), weight 81.6 kg. Physical Exam  Constitutional: She is oriented to person, place, and time. She appears well-developed and well-nourished.  HENT:  Head: Normocephalic and atraumatic.  Eyes: Pupils are equal, round, and reactive to light. EOM are normal.  Cardiovascular: Normal rate.  Respiratory: Effort normal.  GI: Soft. She exhibits no distension.  Musculoskeletal: She exhibits no edema or tenderness.  Neurological: She is alert and oriented to person, place, and time.  Skin: Skin is warm. No rash noted. No erythema. No pallor.  Psychiatric: She has a normal mood and affect. Her behavior is normal. Judgment and thought content normal.     Assessment/Plan Plan exchange surgery on the right with placement of a silicone implant and left breast reduction/mastopexy for symmetry. The risks that can be encountered with and after  placement of a breast implant placement were discussed.  Belleair Shore, DO 01/07/2018, 7:06 AM

## 2018-01-07 NOTE — Op Note (Signed)
Op report   DATE OF OPERATION:  01/07/2018  LOCATION: [place]  SURGICAL DIVISION: Plastic Surgery  PREOPERATIVE DIAGNOSES:  1. History of right breast cancer.  2. Acquired absence of right breast.  3. Left breast asymmetry.  POSTOPERATIVE DIAGNOSES:  1. History of right breast cancer.  2. Acquired absence of right breast.  3. Left breast asymmetry.  PROCEDURE:  1. Exchange of tissue expander for implant. 2. Capsulotomies for implant respositioning. 3. Left breast reduction reduction with liposuction 350 g  SURGEON: Adasha Boehme Sanger Maksym Pfiffner, DO  ANESTHESIA:  General.   COMPLICATIONS: None.   IMPLANTS: [employer] Smooth Round High Profile Gel 500cc. Ref #[id number].  Serial Number [id number]  INDICATIONS FOR PROCEDURE:  The patient, Catherine Robbins, is a 62 y.o. female born on 1955-07-14, is here for treatment for further treatment after a mastectomy and placement of a tissue expander. She now presents for exchange of her expander for an implant.  She requires capsulotomies to better position the implant. MRN: [id number]  CONSENT:  Informed consent was obtained directly from the patient. Risks, benefits and alternatives were fully discussed. Specific risks including but not limited to bleeding, infection, hematoma, seroma, scarring, pain, implant infection, implant extrusion, capsular contracture, asymmetry, wound healing problems, and need for further surgery were all discussed. The patient did have an ample opportunity to have her questions answered to her satisfaction.   DESCRIPTION OF PROCEDURE:  The patient was taken to the operating room. SCDs were placed and IV antibiotics were given. The patient's chest was prepped and draped in a sterile fashion. A time out was performed and the implants to be used were identified.  One percent Xylocaine with epinephrine was used to infiltrate the area. Tumescent was placed laterally on each breast.  Right: The old  mastectomy scar was opened and superior mastectomy and inferior mastectomy flaps were re-raised over the pectoralis major muscle. The pectoralis was split to expose the tissue expander which was removed. Inspection of the pocket showed a normal healthy capsule and good integration of the biologic matrix.  Circumferential capsulotomies were performed to allow for breast pocket expansion.  Measurements were made to confirm adequate pocket size for the implant dimensions.  Hemostasis was ensured with electrocautery.  The pocket was irrigated with antibiotic solution.  New gloves were placed.  The implant was placed in the pocket and oriented appropriately. The pectoralis major muscle and capsule on the anterior surface were re-closed with a 3-0 running Monocryl suture. The remaining skin was closed with 4-0 Monocryl deep dermal and 5-0 Monocryl subcuticular stitches.  Liposuction was done laterally to improve contour  Left side: Preoperative markings were confirmed.  Incision lines were injected with 1% Xylocaine with epinephrine.  After waiting for vasoconstriction, the marked lines were incised. Liposuction was done to improve contour.  A Wise-pattern superomedial breast reduction was performed by de-epithelializing the pedicle, using bovie to create the superomedial pedicle, and removing breast tissue from the superior, lateral, and inferior portions of the breast.  Care was taken to not undermine the breast pedicle. Hemostasis was achieved.  The nipple was gently rotated into position and the soft tissue was closed with 4-0 Monocryl.  The patient was sat upright and size and shape symmetry was confirmed.  Hemostasis was confirmed.  The deep tissues were approximated with 3-0 monocryl sutures and the skin was closed with deep dermal and subcuticular 4-0 Monocryl sutures.  Dermabond was applied.  A breast binder and ABDs were placed.  The nipple  and skin flaps had good capillary refill at the end of the procedure.   The patient tolerated the procedure well. The patient was allowed to wake from anesthesia and taken to the recovery room in satisfactory condition

## 2018-01-07 NOTE — Anesthesia Procedure Notes (Signed)
Procedure Name: LMA Insertion Date/Time: 01/07/2018 10:40 AM Performed by: Bufford Spikes, CRNA Pre-anesthesia Checklist: Patient identified, Emergency Drugs available, Suction available and Patient being monitored Patient Re-evaluated:Patient Re-evaluated prior to induction Oxygen Delivery Method: Circle system utilized Preoxygenation: Pre-oxygenation with 100% oxygen Induction Type: IV induction Ventilation: Mask ventilation without difficulty LMA: LMA inserted LMA Size: 4.0 Number of attempts: 1 Airway Equipment and Method: Bite block Placement Confirmation: positive ETCO2 Tube secured with: Tape Dental Injury: Teeth and Oropharynx as per pre-operative assessment

## 2018-01-07 NOTE — Transfer of Care (Signed)
Immediate Anesthesia Transfer of Care Note  Patient: Catherine Robbins  Procedure(s) Performed: REMOVAL OF TISSUE EXPANDER AND PLACEMENT OF IMPLANT (Right Breast) breast reduction/mastopexy for symmetry left (Left Breast)  Patient Location: PACU  Anesthesia Type:General  Level of Consciousness: awake, alert  and oriented  Airway & Oxygen Therapy: Patient Spontanous Breathing and Patient connected to face mask oxygen  Post-op Assessment: Report given to RN and Post -op Vital signs reviewed and stable  Post vital signs: Reviewed and stable  Last Vitals:  Vitals Value Taken Time  BP 155/86 01/07/2018  1:13 PM  Temp    Pulse 59 01/07/2018  1:14 PM  Resp 11 01/07/2018  1:14 PM  SpO2 100 % 01/07/2018  1:14 PM  Vitals shown include unvalidated device data.  Last Pain:  Vitals:   01/07/18 0824  TempSrc: Oral  PainSc: 0-No pain         Complications: No apparent anesthesia complications

## 2018-01-07 NOTE — Discharge Instructions (Signed)
No heavy lifting. Continue sports bra or breast binder. May shower tomorrow.   No ibuprofen or Tylenol until 8:15pm!   Call your surgeon if you experience:   1.  Fever over 101.0. 2.  Inability to urinate. 3.  Nausea and/or vomiting. 4.  Extreme swelling or bruising at the surgical site. 5.  Continued bleeding from the incision. 6.  Increased pain, redness or drainage from the incision. 7.  Problems related to your pain medication. 8.  Any problems and/or concerns    Post Anesthesia Home Care Instructions  Activity: Get plenty of rest for the remainder of the day. A responsible individual must stay with you for 24 hours following the procedure.  For the next 24 hours, DO NOT: -Drive a car -Paediatric nurse -Drink alcoholic beverages -Take any medication unless instructed by your physician -Make any legal decisions or sign important papers.  Meals: Start with liquid foods such as gelatin or soup. Progress to regular foods as tolerated. Avoid greasy, spicy, heavy foods. If nausea and/or vomiting occur, drink only clear liquids until the nausea and/or vomiting subsides. Call your physician if vomiting continues.  Special Instructions/Symptoms: Your throat may feel dry or sore from the anesthesia or the breathing tube placed in your throat during surgery. If this causes discomfort, gargle with warm salt water. The discomfort should disappear within 24 hours.  If you had a scopolamine patch placed behind your ear for the management of post- operative nausea and/or vomiting:  1. The medication in the patch is effective for 72 hours, after which it should be removed.  Wrap patch in a tissue and discard in the trash. Wash hands thoroughly with soap and water. 2. You may remove the patch earlier than 72 hours if you experience unpleasant side effects which may include dry mouth, dizziness or visual disturbances. 3. Avoid touching the patch. Wash your hands with soap and water after  contact with the patch.

## 2018-01-07 NOTE — Interval H&P Note (Signed)
History and Physical Interval Note:  01/07/2018 9:55 AM  Catherine Robbins  has presented today for surgery, with the diagnosis of Acquired absence of right breast, Malignant neoplasm of upper-outer quadrant of right breast in female, estrogen receptor positive  The various methods of treatment have been discussed with the patient and family. After consideration of risks, benefits and other options for treatment, the patient has consented to  Procedure(s): REMOVAL OF TISSUE EXPANDER AND PLACEMENT OF IMPLANT (Right) breast reduction/mastopexy for symmetry (Left) as a surgical intervention .  The patient's history has been reviewed, patient examined, no change in status, stable for surgery.  I have reviewed the patient's chart and labs.  Questions were answered to the patient's satisfaction.     Loel Lofty Dillingham

## 2018-01-07 NOTE — Anesthesia Preprocedure Evaluation (Signed)
Anesthesia Evaluation  Patient identified by MRN, date of birth, ID band Patient awake    Reviewed: Allergy & Precautions, NPO status , Patient's Chart, lab work & pertinent test results  History of Anesthesia Complications Negative for: history of anesthetic complications  Airway Mallampati: I  TM Distance: >3 FB Neck ROM: Full    Dental  (+) Teeth Intact, Dental Advisory Given   Pulmonary former smoker,    Pulmonary exam normal breath sounds clear to auscultation       Cardiovascular negative cardio ROS   Rhythm:Regular     Neuro/Psych negative neurological ROS  negative psych ROS   GI/Hepatic negative GI ROS, Neg liver ROS,   Endo/Other  negative endocrine ROS  Renal/GU negative Renal ROS     Musculoskeletal   Abdominal   Peds  Hematology Von willebrand   Anesthesia Other Findings   Reproductive/Obstetrics                             Anesthesia Physical Anesthesia Plan  ASA: II  Anesthesia Plan: General   Post-op Pain Management:    Induction: Intravenous  PONV Risk Score and Plan: 3 and Dexamethasone, Ondansetron, Midazolam and Propofol infusion  Airway Management Planned: LMA and Oral ETT  Additional Equipment: None  Intra-op Plan:   Post-operative Plan: Extubation in OR  Informed Consent: I have reviewed the patients History and Physical, chart, labs and discussed the procedure including the risks, benefits and alternatives for the proposed anesthesia with the patient or authorized representative who has indicated his/her understanding and acceptance.   Dental advisory given  Plan Discussed with: CRNA and Surgeon  Anesthesia Plan Comments:         Anesthesia Quick Evaluation

## 2018-01-08 NOTE — Anesthesia Postprocedure Evaluation (Signed)
Anesthesia Post Note  Patient: Catherine Robbins  Procedure(s) Performed: REMOVAL OF TISSUE EXPANDER AND PLACEMENT OF IMPLANT (Right Breast) breast reduction/mastopexy for symmetry left (Left Breast)     Patient location during evaluation: PACU Anesthesia Type: General Level of consciousness: awake and alert Pain management: pain level controlled Vital Signs Assessment: post-procedure vital signs reviewed and stable Respiratory status: spontaneous breathing, nonlabored ventilation, respiratory function stable and patient connected to nasal cannula oxygen Cardiovascular status: blood pressure returned to baseline and stable Postop Assessment: no apparent nausea or vomiting Anesthetic complications: no    Last Vitals:  Vitals:   01/07/18 1430 01/07/18 1450  BP: (!) 152/81 (!) 155/69  Pulse: (!) 57 (!) 59  Resp: 13 16  Temp:  36.6 C  SpO2: 100% 100%    Last Pain:  Vitals:   01/07/18 1450  TempSrc:   PainSc: 5                  Quetzali Heinle

## 2018-01-10 ENCOUNTER — Encounter (HOSPITAL_BASED_OUTPATIENT_CLINIC_OR_DEPARTMENT_OTHER): Payer: Self-pay | Admitting: Plastic Surgery

## 2018-01-26 ENCOUNTER — Other Ambulatory Visit: Payer: Self-pay | Admitting: Plastic Surgery

## 2018-01-26 ENCOUNTER — Telehealth: Payer: Self-pay | Admitting: Oncology

## 2018-01-26 ENCOUNTER — Other Ambulatory Visit: Payer: Self-pay

## 2018-01-26 ENCOUNTER — Encounter (HOSPITAL_BASED_OUTPATIENT_CLINIC_OR_DEPARTMENT_OTHER): Payer: Self-pay | Admitting: *Deleted

## 2018-01-26 NOTE — H&P (Signed)
      Patient of Dr. Marla Roe that underwent staged breast reconstruction implant based. Nearly 3 weeks post implant exchange and has had recurrent fluid collection right chest.   She underwent a right breast lumpectomy in 2006 for an invasive ductal carcinoma (ER / PR positive, HER-2 negaive). She had adjuvant radiation and took tamoxifen for one month. She took the Exemestane for 5 years. On a screening mammogram 08/11/2017 there was an abnormality in the right breast. Ultrasound and biopsy showed new 2.6 cm irregular mass at the 11 o'clock right upper outer quadrant and right upper outer quadrant. She was found to have invasive ductal carcinoma, grade II. ER / PR positive with Ki67 25%, HER2 not amplified.     01/07/2018 underwent left breast reduction exchange surgery for final implant right Mentor smooth high profile 500cc   01/11/2018 she was seen in the office for a postop visit and fluid was drained from her right breast per DR. Dillingham  01/19/2018 50 cc of dark watery fluid consistent with old hematoma aspirated   Pt presented to clinic today for a follow-up visit and states she believes the fluid collection has returned, also state she has had a low grade temperature, 99.0, generalized malaise, night sweats, N/V and loss of appetite.   O: BP 164/92   Pulse 60   Temp 99.2 F (37.3 C)   Ht 1.778 m ('5\' 10"'$ )   Wt 81.6 kg (180 lb)   BMI 25.83 kg/m   Right breast: wound intact, palpable fluid collection inferior to implant, no evidence of cellulitis  Left breast: wounds intact and healing well, nipple pink CV: normal heart sounds PULM: clear to auscultation      A/P:S/p implant based reconstruction right chest, left breast reduction  Recurrent fluid collection right chest post aspiration with now low grade temps. Counseled I recommend drain placement and implant exchange to try to salvage reconstruction. Antibiotics started today. Counseled if fevers increase or  erythema develops may require removal implant. This would mean starting over with TE. Plan surgery tomorrow.  Irene Limbo, MD [facility] Plastic & Reconstructive Surgery [phone], pin [phone]

## 2018-01-26 NOTE — Telephone Encounter (Signed)
Patient called to cancel, she will call back to reschedule when ready

## 2018-01-27 ENCOUNTER — Ambulatory Visit (HOSPITAL_BASED_OUTPATIENT_CLINIC_OR_DEPARTMENT_OTHER): Payer: BC Managed Care – PPO | Admitting: Anesthesiology

## 2018-01-27 ENCOUNTER — Ambulatory Visit: Payer: BC Managed Care – PPO | Admitting: Oncology

## 2018-01-27 ENCOUNTER — Other Ambulatory Visit: Payer: BC Managed Care – PPO

## 2018-01-27 ENCOUNTER — Ambulatory Visit (HOSPITAL_BASED_OUTPATIENT_CLINIC_OR_DEPARTMENT_OTHER)
Admission: RE | Admit: 2018-01-27 | Discharge: 2018-01-27 | Disposition: A | Discharge status: Home / Self Care | Payer: BC Managed Care – PPO | Source: Ambulatory Visit | Attending: Plastic Surgery | Admitting: Plastic Surgery

## 2018-01-27 ENCOUNTER — Other Ambulatory Visit: Payer: Self-pay

## 2018-01-27 ENCOUNTER — Encounter (HOSPITAL_BASED_OUTPATIENT_CLINIC_OR_DEPARTMENT_OTHER): Payer: Self-pay

## 2018-01-27 ENCOUNTER — Encounter (HOSPITAL_BASED_OUTPATIENT_CLINIC_OR_DEPARTMENT_OTHER)
Admission: RE | Disposition: A | Discharge status: Home / Self Care | Payer: Self-pay | Source: Ambulatory Visit | Attending: Plastic Surgery

## 2018-01-27 DIAGNOSIS — Z923 Personal history of irradiation: Secondary | ICD-10-CM | POA: Insufficient documentation

## 2018-01-27 DIAGNOSIS — Z170 Estrogen receptor positive status [ER+]: Secondary | ICD-10-CM | POA: Insufficient documentation

## 2018-01-27 DIAGNOSIS — Z87891 Personal history of nicotine dependence: Secondary | ICD-10-CM | POA: Diagnosis not present

## 2018-01-27 DIAGNOSIS — D680 Von Willebrand's disease: Secondary | ICD-10-CM | POA: Insufficient documentation

## 2018-01-27 DIAGNOSIS — E039 Hypothyroidism, unspecified: Secondary | ICD-10-CM | POA: Insufficient documentation

## 2018-01-27 DIAGNOSIS — M96843 Postprocedural seroma of a musculoskeletal structure following other procedure: Secondary | ICD-10-CM | POA: Diagnosis not present

## 2018-01-27 DIAGNOSIS — Y838 Other surgical procedures as the cause of abnormal reaction of the patient, or of later complication, without mention of misadventure at the time of the procedure: Secondary | ICD-10-CM | POA: Insufficient documentation

## 2018-01-27 DIAGNOSIS — Z853 Personal history of malignant neoplasm of breast: Secondary | ICD-10-CM | POA: Diagnosis not present

## 2018-01-27 DIAGNOSIS — T85898A Other specified complication of other internal prosthetic devices, implants and grafts, initial encounter: Secondary | ICD-10-CM | POA: Diagnosis present

## 2018-01-27 DIAGNOSIS — I10 Essential (primary) hypertension: Secondary | ICD-10-CM | POA: Diagnosis not present

## 2018-01-27 HISTORY — PX: BREAST IMPLANT REMOVAL: SHX5361

## 2018-01-27 LAB — CBC WITH DIFFERENTIAL/PLATELET
Abs Immature Granulocytes: 0 10*3/uL (ref 0.0–0.1)
BASOS ABS: 0 10*3/uL (ref 0.0–0.1)
BASOS PCT: 1 %
EOS PCT: 3 %
Eosinophils Absolute: 0.2 10*3/uL (ref 0.0–0.7)
HCT: 41.8 % (ref 36.0–46.0)
Hemoglobin: 13.6 g/dL (ref 12.0–15.0)
Immature Granulocytes: 0 %
Lymphocytes Relative: 27 %
Lymphs Abs: 1.8 10*3/uL (ref 0.7–4.0)
MCH: 29.8 pg (ref 26.0–34.0)
MCHC: 32.5 g/dL (ref 30.0–36.0)
MCV: 91.5 fL (ref 78.0–100.0)
Monocytes Absolute: 0.5 10*3/uL (ref 0.1–1.0)
Monocytes Relative: 7 %
Neutro Abs: 4.1 10*3/uL (ref 1.7–7.7)
Neutrophils Relative %: 62 %
PLATELETS: 245 10*3/uL (ref 150–400)
RBC: 4.57 MIL/uL (ref 3.87–5.11)
RDW: 13.7 % (ref 11.5–15.5)
WBC: 6.7 10*3/uL (ref 4.0–10.5)

## 2018-01-27 SURGERY — REMOVAL, IMPLANT, BREAST
Anesthesia: General | Site: Breast | Laterality: Right

## 2018-01-27 MED ORDER — FENTANYL CITRATE (PF) 100 MCG/2ML IJ SOLN
INTRAMUSCULAR | Status: AC
  Filled 2018-01-27: qty 2

## 2018-01-27 MED ORDER — LIDOCAINE 2% (20 MG/ML) 5 ML SYRINGE
INTRAMUSCULAR | Status: AC
  Filled 2018-01-27: qty 5

## 2018-01-27 MED ORDER — ACETAMINOPHEN 500 MG PO TABS
ORAL_TABLET | ORAL | Status: AC
  Filled 2018-01-27: qty 2

## 2018-01-27 MED ORDER — CELECOXIB 200 MG PO CAPS
ORAL_CAPSULE | ORAL | Status: AC
  Filled 2018-01-27: qty 1

## 2018-01-27 MED ORDER — OXYCODONE HCL 5 MG PO TABS
5.0000 mg | ORAL_TABLET | Freq: Once | ORAL | Status: AC
  Administered 2018-01-27: 5 mg via ORAL

## 2018-01-27 MED ORDER — ONDANSETRON HCL 4 MG/2ML IJ SOLN
INTRAMUSCULAR | Status: DC | PRN
  Administered 2018-01-27: 4 mg via INTRAVENOUS

## 2018-01-27 MED ORDER — PHENYLEPHRINE HCL 10 MG/ML IJ SOLN
INTRAMUSCULAR | Status: AC
  Filled 2018-01-27: qty 1

## 2018-01-27 MED ORDER — FAMOTIDINE 20 MG PO TABS
ORAL_TABLET | ORAL | Status: AC
  Filled 2018-01-27: qty 1

## 2018-01-27 MED ORDER — GLYCOPYRROLATE 0.2 MG/ML IJ SOLN
INTRAMUSCULAR | Status: DC | PRN
  Administered 2018-01-27: 2 mg via INTRAVENOUS

## 2018-01-27 MED ORDER — VANCOMYCIN HCL IN DEXTROSE 1-5 GM/200ML-% IV SOLN
INTRAVENOUS | Status: AC
  Filled 2018-01-27: qty 200

## 2018-01-27 MED ORDER — POVIDONE-IODINE 10 % EX SOLN
CUTANEOUS | Status: DC | PRN
  Administered 2018-01-27: 1 via TOPICAL

## 2018-01-27 MED ORDER — LIDOCAINE HCL (CARDIAC) PF 100 MG/5ML IV SOSY
PREFILLED_SYRINGE | INTRAVENOUS | Status: DC | PRN
  Administered 2018-01-27: 50 mg via INTRAVENOUS

## 2018-01-27 MED ORDER — HYDROCODONE-ACETAMINOPHEN 5-325 MG PO TABS: 1.0000 | ORAL_TABLET | ORAL | 0 refills | Status: DC | PRN

## 2018-01-27 MED ORDER — MIDAZOLAM HCL 2 MG/2ML IJ SOLN
INTRAMUSCULAR | Status: AC
  Filled 2018-01-27: qty 2

## 2018-01-27 MED ORDER — PROPOFOL 10 MG/ML IV BOLUS
INTRAVENOUS | Status: DC | PRN
  Administered 2018-01-27: 150 mg via INTRAVENOUS

## 2018-01-27 MED ORDER — DEXAMETHASONE SODIUM PHOSPHATE 4 MG/ML IJ SOLN
INTRAMUSCULAR | Status: DC | PRN
  Administered 2018-01-27: 10 mg via INTRAVENOUS

## 2018-01-27 MED ORDER — ROCURONIUM BROMIDE 100 MG/10ML IV SOLN
INTRAVENOUS | Status: DC | PRN
  Administered 2018-01-27: 35 mg via INTRAVENOUS

## 2018-01-27 MED ORDER — BUPIVACAINE-EPINEPHRINE 0.25% -1:200000 IJ SOLN
INTRAMUSCULAR | Status: DC | PRN
  Administered 2018-01-27: 20 mL

## 2018-01-27 MED ORDER — PROMETHAZINE HCL 25 MG/ML IJ SOLN: 6.2500 mg | INTRAMUSCULAR | Status: DC | PRN

## 2018-01-27 MED ORDER — MIDAZOLAM HCL 2 MG/2ML IJ SOLN
1.0000 mg | INTRAMUSCULAR | Status: DC | PRN
  Administered 2018-01-27: 2 mg via INTRAVENOUS

## 2018-01-27 MED ORDER — LACTATED RINGERS IV SOLN
INTRAVENOUS | Status: DC
  Administered 2018-01-27: 15:00:00 via INTRAVENOUS

## 2018-01-27 MED ORDER — CELECOXIB 200 MG PO CAPS
200.0000 mg | ORAL_CAPSULE | ORAL | Status: AC
  Administered 2018-01-27: 200 mg via ORAL

## 2018-01-27 MED ORDER — ROCURONIUM BROMIDE 50 MG/5ML IV SOSY
PREFILLED_SYRINGE | INTRAVENOUS | Status: AC
  Filled 2018-01-27: qty 5

## 2018-01-27 MED ORDER — ACETAMINOPHEN 500 MG PO TABS
1000.0000 mg | ORAL_TABLET | ORAL | Status: AC
  Administered 2018-01-27: 1000 mg via ORAL

## 2018-01-27 MED ORDER — GABAPENTIN 300 MG PO CAPS
300.0000 mg | ORAL_CAPSULE | ORAL | Status: AC
  Administered 2018-01-27: 300 mg via ORAL

## 2018-01-27 MED ORDER — VANCOMYCIN HCL IN DEXTROSE 1-5 GM/200ML-% IV SOLN
1000.0000 mg | INTRAVENOUS | Status: AC
  Administered 2018-01-27: 1000 mg via INTRAVENOUS

## 2018-01-27 MED ORDER — GLYCOPYRROLATE PF 0.2 MG/ML IJ SOSY
PREFILLED_SYRINGE | INTRAMUSCULAR | Status: AC
  Filled 2018-01-27: qty 1

## 2018-01-27 MED ORDER — SODIUM CHLORIDE 0.9 % IV SOLN
INTRAVENOUS | Status: DC | PRN
  Administered 2018-01-27: 650 mL

## 2018-01-27 MED ORDER — ONDANSETRON HCL 4 MG/2ML IJ SOLN
INTRAMUSCULAR | Status: AC
  Filled 2018-01-27: qty 2

## 2018-01-27 MED ORDER — SUGAMMADEX SODIUM 500 MG/5ML IV SOLN
INTRAVENOUS | Status: AC
  Filled 2018-01-27: qty 5

## 2018-01-27 MED ORDER — DEXAMETHASONE SODIUM PHOSPHATE 10 MG/ML IJ SOLN
INTRAMUSCULAR | Status: AC
  Filled 2018-01-27: qty 1

## 2018-01-27 MED ORDER — GABAPENTIN 300 MG PO CAPS
ORAL_CAPSULE | ORAL | Status: AC
  Filled 2018-01-27: qty 1

## 2018-01-27 MED ORDER — SODIUM CHLORIDE 0.9 % IV SOLN
INTRAVENOUS | Status: DC | PRN
  Administered 2018-01-27: 50 ug/min via INTRAVENOUS

## 2018-01-27 MED ORDER — FENTANYL CITRATE (PF) 100 MCG/2ML IJ SOLN
50.0000 ug | INTRAMUSCULAR | Status: DC | PRN
  Administered 2018-01-27: 100 ug via INTRAVENOUS

## 2018-01-27 MED ORDER — FAMOTIDINE 20 MG PO TABS
20.0000 mg | ORAL_TABLET | Freq: Once | ORAL | Status: AC
  Administered 2018-01-27: 20 mg via ORAL

## 2018-01-27 MED ORDER — FENTANYL CITRATE (PF) 100 MCG/2ML IJ SOLN
25.0000 ug | INTRAMUSCULAR | Status: DC | PRN
  Administered 2018-01-27 (×3): 50 ug via INTRAVENOUS

## 2018-01-27 MED ORDER — SCOPOLAMINE 1 MG/3DAYS TD PT72: 1.0000 | MEDICATED_PATCH | Freq: Once | TRANSDERMAL | Status: DC | PRN

## 2018-01-27 MED ORDER — VANCOMYCIN HCL IN DEXTROSE 500-5 MG/100ML-% IV SOLN
INTRAVENOUS | Status: AC
  Filled 2018-01-27: qty 100

## 2018-01-27 MED ORDER — PROPOFOL 10 MG/ML IV BOLUS
INTRAVENOUS | Status: AC
  Filled 2018-01-27: qty 20

## 2018-01-27 MED ORDER — SUGAMMADEX SODIUM 500 MG/5ML IV SOLN
INTRAVENOUS | Status: DC | PRN
  Administered 2018-01-27: 300 mg via INTRAVENOUS

## 2018-01-27 MED ORDER — OXYCODONE HCL 5 MG PO TABS
ORAL_TABLET | ORAL | Status: AC
  Filled 2018-01-27: qty 1

## 2018-01-27 SURGICAL SUPPLY — 77 items
ADH SKN CLS APL DERMABOND .7 (GAUZE/BANDAGES/DRESSINGS) ×1
BAG DECANTER FOR FLEXI CONT (MISCELLANEOUS) ×2 IMPLANT
BINDER BREAST LRG (GAUZE/BANDAGES/DRESSINGS) IMPLANT
BINDER BREAST MEDIUM (GAUZE/BANDAGES/DRESSINGS) IMPLANT
BINDER BREAST XLRG (GAUZE/BANDAGES/DRESSINGS) IMPLANT
BINDER BREAST XXLRG (GAUZE/BANDAGES/DRESSINGS) ×1 IMPLANT
BLADE SURG 10 STRL SS (BLADE) ×2 IMPLANT
BLADE SURG 15 STRL LF DISP TIS (BLADE) IMPLANT
BLADE SURG 15 STRL SS (BLADE) ×2
BNDG GAUZE ELAST 4 BULKY (GAUZE/BANDAGES/DRESSINGS) IMPLANT
CANISTER SUCT 1200ML W/VALVE (MISCELLANEOUS) ×2 IMPLANT
CHLORAPREP W/TINT 26ML (MISCELLANEOUS) ×1 IMPLANT
COVER BACK TABLE 60X90IN (DRAPES) ×2 IMPLANT
COVER MAYO STAND STRL (DRAPES) ×2 IMPLANT
DECANTER SPIKE VIAL GLASS SM (MISCELLANEOUS) IMPLANT
DERMABOND ADVANCED (GAUZE/BANDAGES/DRESSINGS) ×1
DERMABOND ADVANCED .7 DNX12 (GAUZE/BANDAGES/DRESSINGS) ×1 IMPLANT
DRAIN CHANNEL 15F RND FF W/TCR (WOUND CARE) IMPLANT
DRAIN CHANNEL 19F RND (DRAIN) ×1 IMPLANT
DRAPE INCISE IOBAN 66X45 STRL (DRAPES) IMPLANT
DRAPE TOP ARMCOVERS (MISCELLANEOUS) ×2 IMPLANT
DRAPE U-SHAPE 76X120 STRL (DRAPES) ×2 IMPLANT
DRSG PAD ABDOMINAL 8X10 ST (GAUZE/BANDAGES/DRESSINGS) ×2 IMPLANT
DRSG TEGADERM 2-3/8X2-3/4 SM (GAUZE/BANDAGES/DRESSINGS) ×1 IMPLANT
ELECT BLADE 4.0 EZ CLEAN MEGAD (MISCELLANEOUS)
ELECT BLADE 6.5 EXT (BLADE) IMPLANT
ELECT COATED BLADE 2.86 ST (ELECTRODE) ×2 IMPLANT
ELECT REM PT RETURN 9FT ADLT (ELECTROSURGICAL) ×2
ELECTRODE BLDE 4.0 EZ CLN MEGD (MISCELLANEOUS) IMPLANT
ELECTRODE REM PT RTRN 9FT ADLT (ELECTROSURGICAL) ×1 IMPLANT
EVACUATOR SILICONE 100CC (DRAIN) ×1 IMPLANT
GAUZE SPONGE 4X4 12PLY STRL (GAUZE/BANDAGES/DRESSINGS) IMPLANT
GAUZE SPONGE 4X4 12PLY STRL LF (GAUZE/BANDAGES/DRESSINGS) IMPLANT
GLOVE BIO SURGEON STRL SZ 6 (GLOVE) ×3 IMPLANT
GLOVE BIO SURGEON STRL SZ 6.5 (GLOVE) ×1 IMPLANT
GLOVE BIO SURGEON STRL SZ7 (GLOVE) ×1 IMPLANT
GLOVE EXAM NITRILE MD LF STRL (GLOVE) ×1 IMPLANT
GOWN STRL REUS W/ TWL LRG LVL3 (GOWN DISPOSABLE) ×2 IMPLANT
GOWN STRL REUS W/ TWL XL LVL3 (GOWN DISPOSABLE) IMPLANT
GOWN STRL REUS W/TWL LRG LVL3 (GOWN DISPOSABLE) ×4
GOWN STRL REUS W/TWL XL LVL3 (GOWN DISPOSABLE) ×2
IMPL BREAST FULL SHL 560 (Breast) IMPLANT
IMPL BRST FULL SHL 560CC (Breast) ×1 IMPLANT
IMPLANT BREAST GEL 560CC (Breast) ×2 IMPLANT
MARKER SKIN DUAL TIP RULER LAB (MISCELLANEOUS) IMPLANT
NDL HYPO 25X1 1.5 SAFETY (NEEDLE) IMPLANT
NEEDLE HYPO 25X1 1.5 SAFETY (NEEDLE) ×2 IMPLANT
NS IRRIG 1000ML POUR BTL (IV SOLUTION) IMPLANT
PACK BASIN DAY SURGERY FS (CUSTOM PROCEDURE TRAY) ×2 IMPLANT
PENCIL BUTTON HOLSTER BLD 10FT (ELECTRODE) ×2 IMPLANT
PIN SAFETY STERILE (MISCELLANEOUS) ×1 IMPLANT
SHEET MEDIUM DRAPE 40X70 STRL (DRAPES) ×2 IMPLANT
SIZER BREAST REUSE 560CC (SIZER) ×2
SIZER BRST REUSE P5.7X 560CC (SIZER) IMPLANT
SLEEVE SCD COMPRESS KNEE MED (MISCELLANEOUS) ×2 IMPLANT
SPONGE LAP 18X18 RF (DISPOSABLE) ×4 IMPLANT
STAPLER VISISTAT 35W (STAPLE) ×2 IMPLANT
STRIP CLOSURE SKIN 1/2X4 (GAUZE/BANDAGES/DRESSINGS) IMPLANT
SUT ETHILON 2 0 FS 18 (SUTURE) ×1 IMPLANT
SUT ETHILON 4 0 PS 2 18 (SUTURE) ×2 IMPLANT
SUT MNCRL AB 4-0 PS2 18 (SUTURE) ×2 IMPLANT
SUT MON AB 3-0 SH 27 (SUTURE) ×8
SUT MON AB 3-0 SH27 (SUTURE) IMPLANT
SUT VIC AB 3-0 PS1 18 (SUTURE)
SUT VIC AB 3-0 PS1 18XBRD (SUTURE) IMPLANT
SUT VIC AB 3-0 SH 27 (SUTURE)
SUT VIC AB 3-0 SH 27X BRD (SUTURE) IMPLANT
SUT VICRYL 4-0 PS2 18IN ABS (SUTURE) ×1 IMPLANT
SWAB COLLECTION DEVICE MRSA (MISCELLANEOUS) ×1 IMPLANT
SWAB CULTURE ESWAB REG 1ML (MISCELLANEOUS) ×1 IMPLANT
SYR 50ML LL SCALE MARK (SYRINGE) IMPLANT
SYR BULB IRRIGATION 50ML (SYRINGE) ×2 IMPLANT
SYR CONTROL 10ML LL (SYRINGE) ×1 IMPLANT
TOWEL GREEN STERILE FF (TOWEL DISPOSABLE) ×5 IMPLANT
TUBE CONNECTING 20X1/4 (TUBING) ×3 IMPLANT
UNDERPAD 30X30 (UNDERPADS AND DIAPERS) ×4 IMPLANT
YANKAUER SUCT BULB TIP NO VENT (SUCTIONS) ×2 IMPLANT

## 2018-01-27 NOTE — Transfer of Care (Signed)
Immediate Anesthesia Transfer of Care Note  Patient: Catherine Robbins  Procedure(s) Performed: REMOVE AND REPLACE  BREAST IMPLANT - WITH DRAIN PLACEMENT (Right Breast)  Patient Location: PACU  Anesthesia Type:General  Level of Consciousness: awake and sedated  Airway & Oxygen Therapy: Patient Spontanous Breathing and Patient connected to face mask oxygen  Post-op Assessment: Report given to RN and Post -op Vital signs reviewed and stable  Post vital signs: Reviewed and stable  Last Vitals:  Vitals Value Taken Time  BP 133/76 01/27/2018  5:09 PM  Temp    Pulse 58 01/27/2018  5:11 PM  Resp 13 01/27/2018  5:11 PM  SpO2 100 % 01/27/2018  5:11 PM  Vitals shown include unvalidated device data.  Last Pain:  Vitals:   01/27/18 1400  TempSrc: Oral  PainSc: 7       Patients Stated Pain Goal: 4 (82/50/53 9767)  Complications: No apparent anesthesia complications

## 2018-01-27 NOTE — Progress Notes (Signed)
Pt states nausea present and does not want to take ERAS Meds. Will alert MD

## 2018-01-27 NOTE — Discharge Instructions (Signed)

## 2018-01-27 NOTE — Anesthesia Procedure Notes (Signed)
Procedure Name: Intubation Performed by: Terrance Mass, CRNA Pre-anesthesia Checklist: Patient identified, Emergency Drugs available, Suction available and Patient being monitored Patient Re-evaluated:Patient Re-evaluated prior to induction Oxygen Delivery Method: Circle system utilized Preoxygenation: Pre-oxygenation with 100% oxygen Induction Type: IV induction Ventilation: Mask ventilation without difficulty Laryngoscope Size: Miller and 2 Tube type: Oral Tube size: 7.0 mm Number of attempts: 1 Airway Equipment and Method: Stylet and Oral airway Placement Confirmation: ETT inserted through vocal cords under direct vision,  positive ETCO2 and breath sounds checked- equal and bilateral Secured at: 22 cm Tube secured with: Tape Dental Injury: Teeth and Oropharynx as per pre-operative assessment

## 2018-01-27 NOTE — Anesthesia Preprocedure Evaluation (Signed)
Anesthesia Evaluation  Patient identified by MRN, date of birth, ID band Patient awake    Reviewed: Allergy & Precautions, NPO status , Patient's Chart, lab work & pertinent test results, reviewed documented beta blocker date and time   Airway Mallampati: II  TM Distance: >3 FB Neck ROM: Full    Dental  (+) Teeth Intact, Dental Advisory Given Upper permanent bridge :   Pulmonary former smoker,    Pulmonary exam normal breath sounds clear to auscultation       Cardiovascular hypertension, Pt. on home beta blockers Normal cardiovascular exam Rhythm:Regular Rate:Normal     Neuro/Psych negative neurological ROS     GI/Hepatic negative GI ROS, Neg liver ROS,   Endo/Other  Hypothyroidism   Renal/GU negative Renal ROS     Musculoskeletal negative musculoskeletal ROS (+)   Abdominal   Peds  Hematology  (+) Blood dyscrasia (Von Willebrand disease ), ,   Anesthesia Other Findings Day of surgery medications reviewed with the patient.  Right breast cancer   Reproductive/Obstetrics                             Anesthesia Physical Anesthesia Plan  ASA: II  Anesthesia Plan: General   Post-op Pain Management:    Induction: Intravenous  PONV Risk Score and Plan: 3 and Dexamethasone, Ondansetron and Midazolam  Airway Management Planned: Oral ETT  Additional Equipment:   Intra-op Plan:   Post-operative Plan: Extubation in OR  Informed Consent: I have reviewed the patients History and Physical, chart, labs and discussed the procedure including the risks, benefits and alternatives for the proposed anesthesia with the patient or authorized representative who has indicated his/her understanding and acceptance.   Dental advisory given  Plan Discussed with: CRNA  Anesthesia Plan Comments:         Anesthesia Quick Evaluation

## 2018-01-27 NOTE — Op Note (Signed)
Operative Note   DATE OF OPERATION: 8.29.19  LOCATION: [facility]-outpatient  SURGICAL DIVISION: Plastic Surgery  PREOPERATIVE DIAGNOSES:  1. Acquired absence breast 2. History right breast cancer 3. History therapeutic radiation 4. Seroma right chest  POSTOPERATIVE DIAGNOSES:  same  PROCEDURE:  Removal replacement right chest silicone implant  SURGEON: Irene Limbo MD MBA  ASSISTANT: none  ANESTHESIA:  General.   EBL: 10 ml  COMPLICATIONS: None immediate.   INDICATIONS FOR PROCEDURE:  The patient, Catherine Robbins, is a 62 y.o. female born on [birth year], is here for treatment recurrent seroma right chest following implant exchange surgery 3 weeks ago. This has recurred following two aspirations and now has developed malaise and low grade temperature.   FINDINGS: Similar to prior aspirations, watery dark old blood fluid noted in space between skin flaps and pectoralis muscle. Pectoralis muscle tight,consistent with prior induration and minimal expansion of medial lower quadrant noted. Some old clots present both above and below muscle. Removed intact implant. Place Natrelle Inspira Smooth Round Full Projection 560 ml REF [id number]  DESCRIPTION OF PROCEDURE:  The patient's operative site was marked with the patient in the preoperative area. The patient was taken to the operating room. SCDs were placed and IV antibiotics were given. The patient's operative site was prepped and draped in a sterile fashion. A time out was performed and all information was confirmed to be correct. Incision made in transverse chest mastectomy scar. Incision carried through subcutaneous tissue and superficial fascia and seroma encountered in space above pectoralis and implant capsule. Cultures obtained. 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 The suturing of pectoralis lower border to lower pole capsule and ADM with largely intact with opening present over lower  outer quadrant of approximately 2 cm2. Implant removed intact. Tight pectoralis noted and capsulotomies performed medially and anteriorly. Cavity irrigated with saline solution containing Ancef gentamicin and bacitracin. Hemostasis ensured. 19 Fr drain placed in largely subcutaneous position superior to lowe pole capsule and beneath pectoralis medially. This was secured with 2-0 nylon. Local anesthetic infiltrated. Cavity irrigated with Betadine. Implant placed and orientation ensured. Closure completed with 3-0 monocryl to approximate pectoralis muscle and lower pole capsule. 3-0 monocryl used to close superficial fascia skin flaps, followed by 4-0 vicryl dermis, short running 4-0 nylon skin closure. Dermabond applied followed by dry dressing and breast binder.   The patient was allowed to wake from anesthesia, extubated and taken to the recovery room in satisfactory condition.   SPECIMENS: cultures  DRAINS: 17 Fr JP in right breast reconstruction.  Irene Limbo, MD [facility] Plastic & Reconstructive Surgery [phone], pin [phone]

## 2018-01-27 NOTE — Interval H&P Note (Signed)
History and Physical Interval Note:  01/27/2018 3:14 PM  Catherine Robbins  has presented today for surgery, with the diagnosis of Malignant neoplasm of upper-outer quadrant of right breast in female, estrogen receptor positive, Postprocedural fluid collection, right breast  The various methods of treatment have been discussed with the patient and family. After consideration of risks, benefits and other options for treatment, the patient has consented to  Procedure(s): REMOVE AND REPLACE  BREAST IMPLANT - WITH DRAIN PLACEMENT (Right) as a surgical intervention .  The patient's history has been reviewed, patient examined, no change in status, stable for surgery.  I have reviewed the patient's chart and labs.  Questions were answered to the patient's satisfaction.     Ailyn Gladd

## 2018-01-28 ENCOUNTER — Encounter (HOSPITAL_BASED_OUTPATIENT_CLINIC_OR_DEPARTMENT_OTHER): Payer: Self-pay | Admitting: Plastic Surgery

## 2018-01-28 NOTE — Anesthesia Postprocedure Evaluation (Signed)
Anesthesia Post Note  Patient: Catherine Robbins  Procedure(s) Performed: REMOVE AND REPLACE  BREAST IMPLANT - WITH DRAIN PLACEMENT (Right Breast)     Patient location during evaluation: PACU Anesthesia Type: General Level of consciousness: awake and alert, awake and oriented Pain management: pain level controlled Vital Signs Assessment: post-procedure vital signs reviewed and stable Respiratory status: spontaneous breathing, nonlabored ventilation and respiratory function stable Cardiovascular status: blood pressure returned to baseline and stable Postop Assessment: no apparent nausea or vomiting Anesthetic complications: no    Last Vitals:  Vitals:   01/27/18 1815 01/27/18 1830  BP: 136/79 (!) 160/88  Pulse: (!) 56 (!) 56  Resp: 10   Temp:  36.4 C  SpO2: 98% 98%    Last Pain:  Vitals:   01/27/18 1830  TempSrc:   PainSc: 4                  Catalina Gravel

## 2018-02-01 ENCOUNTER — Encounter (HOSPITAL_BASED_OUTPATIENT_CLINIC_OR_DEPARTMENT_OTHER): Payer: Self-pay | Admitting: Plastic Surgery

## 2018-02-01 LAB — AEROBIC/ANAEROBIC CULTURE (SURGICAL/DEEP WOUND): CULTURE: NO GROWTH

## 2018-02-01 LAB — AEROBIC/ANAEROBIC CULTURE W GRAM STAIN (SURGICAL/DEEP WOUND)

## 2018-02-18 ENCOUNTER — Telehealth: Payer: Self-pay

## 2018-02-18 NOTE — Telephone Encounter (Signed)
Per 9/20 phone msg return patient requested to R/S her appointment, was able to schedule for December.

## 2018-05-10 NOTE — Progress Notes (Signed)
[place]  [phone]) [phone] [phone]     ID: DEYA BIGOS DOB: 28-Aug-1955  MR#: [id number]  RFX#:[id number]  Patient Care Team: Carol Ada, MD as PCP - General (Family Medicine) Jovita Kussmaul, MD as Consulting Physician (General Surgery) Magrinat, Virgie Dad, MD as Consulting Physician (Oncology) Kyung Rudd, MD as Consulting Physician (Radiation Oncology) Milly Jakob, MD as Consulting Physician (Orthopedic Surgery) OTHER MD:  CHIEF COMPLAINT: Estrogen receptor positive breast cancer  CURRENT TREATMENT: anastrozole   HISTORY OF CURRENT ILLNESS: From the original intake note:  Catherine Robbins has a history of prior right-sided breast cancer, status post lumpectomy on 07/30/2004 for a 1.1 cm invasive ductal carcinoma, grade 1, with negative margins and a single sentinel lymph node clear.  The tumor was estrogen receptor 93% positive, progesterone receptor 83% positive, with an MIB-1 of 7% and no HER-2 amplification ([id number]).  She received adjuvant radiation radiation. She took tamoxifen for only a month due to poor tolerance, including severe hot flashes. She was switched to exemestane and tolerated this well, taking it for a total of 5 years  More recently she had routine screening mammography on 08/11/2017 showing a possible abnormality in the right breast. She underwent unilateral right diagnostic mammography with tomography and right breast ultrasonography at [facility] on 08/13/2017 showing: breast density category A. By ultrasound, the new 2.6 cm irregular mass in the 11 o'clock right upper outer quadrant  posterior depth located 4 cm from the nipple is likely carcinoma. Additionally, there is a 0.4 cm irregular mass in the right upper outer quadrant anterior depth and 3 cm from the nipple. These masses correlated to mammography findings. Evaluation of the right axilla was unremarkable.  Accordingly on 08/17/2017 she proceeded to biopsy of the  right breast mass in question. The pathology from this procedure showed ([id number]): At the 11 o'clock 4 cmfn, invasive ductal carcinoma, grade II. Prognostic indicators significant for: estrogen receptor, 90% positive and progesterone receptor, 63% positive, both with strong staining intensity. Proliferation marker Ki67 at 25%. HER2 not amplified with ratios HER2/CEP17 signals 1.28 and average HER2 copies per cell 2.09.   The patient's subsequent history is as detailed below.  INTERVAL HISTORY: Catherine Robbins returns today for follow-up of her estrogen receptor positive breast cancer.  The patient continues on anastrozole, which she is tolerating well. She is getting hot flashes, but they are tolerable. The hot flashes are worse at night, and can wake her up at night and she is unable to go back to sleep.    Her last bone density screening was on 09/12/2015 at [facility] showing a T-score of -2.1, which is considered osteopenic.  Since her last visit here, she underwent the removal of tissue expander and placement of implant in her right breast, as well as a breast reduction/mastopexy for symmetry in her left breast on 01/07/2018.   Pathology [phone]) from this procedure on 01/07/2018 shows a right mastectomy scar; skin with dermal and subcutaneous inflammation with foreign body giant cells. The liposuction of the right breast tissue revealed fibroadipose tissue. The liposuction of the left breast tissue revealed fibroadipose tissue and skeletal muscle. There is no evidence of malignancy.   On 01/27/2018, she underwent the removal and replacement of her right breast implant. She developed a post-surgical infection, which has since resolved.    REVIEW OF SYSTEMS: Catherine Robbins is doing well overall. She is back to her normal activities. She is running and participating in strength classes. She has retired. For Thanksgiving,  she went to see her father the weekend before. The patient denies unusual  headaches, visual changes, nausea, vomiting, or dizziness. There has been no unusual cough, phlegm production, or pleurisy. This been no change in bowel or bladder habits. The patient denies unexplained fatigue or unexplained weight loss, bleeding, rash, or fever. A detailed review of systems was otherwise noncontributory.    PAST MEDICAL HISTORY: Past Medical History:  Diagnosis Date  . Breast cancer in female [facility])    Right  . Cancer ([place])   . 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 Low sodium levels   . Lyme disease   . Von Willebrand disease ([place])     HTN, HLD, Von Willebrand's Disease, Lyme Disease and Bartonella (chronic since 1998) neuropathy in the feet secondary to the Lyme disease 2004. She is a DES Daughter.   PAST SURGICAL HISTORY: Past Surgical History:  Procedure Laterality Date  . BREAST IMPLANT REMOVAL Right 01/27/2018   Procedure: REMOVE AND REPLACE  BREAST IMPLANT - WITH DRAIN PLACEMENT;  Surgeon: Irene Limbo, MD;  Location: [place];  Service: Plastics;  Laterality: Right;  . BREAST LUMPECTOMY Right   . BREAST RECONSTRUCTION WITH PLACEMENT OF TISSUE EXPANDER AND FLEX HD (ACELLULAR HYDRATED DERMIS) Right 09/27/2017   Procedure: RIGHT BREAST RECONSTRUCTION WITH PLACEMENT OF TISSUE EXPANDER AND FLEX HD (ACELLULAR HYDRATED DERMIS);  Surgeon: Wallace Going, DO;  Location: [place];  Service: Plastics;  Laterality: Right;  . BREAST REDUCTION SURGERY Left 01/07/2018   Procedure: breast reduction/mastopexy for symmetry left;  Surgeon: Wallace Going, DO;  Location: [place];  Service: Plastics;  Laterality: Left;  Marland Kitchen MASTECTOMY W/ SENTINEL NODE BIOPSY Right 09/27/2017   Procedure: RIGHT MASTECTOMY WITH SENTINEL LYMPH NODE BIOPSY;  Surgeon: Jovita Kussmaul, MD;  Location: [place];  Service: General;  Laterality: Right;  . REMOVAL OF TISSUE EXPANDER AND PLACEMENT OF IMPLANT Right 01/07/2018   Procedure: REMOVAL OF TISSUE EXPANDER AND PLACEMENT OF  IMPLANT;  Surgeon: Wallace Going, DO;  Location: [place];  Service: Plastics;  Laterality: Right;  . TONSILLECTOMY    Wisdom teeth extraction in the 76's   FAMILY HISTORY Family History  Problem Relation Age of Onset  . DES usage Mother   . Other Brother        DES exposure  . Heart disease Maternal Grandfather        d. 66  . Other Sister        DES exposure    The patient's father is alive at age 72. The patient's mother died at age 41 from heart disease. The patient has 1 brother and 2 sisters. She denies a history of breast, ovarian, or any other cancers in the family.   GYNECOLOGIC HISTORY:  No LMP recorded. Patient is postmenopausal. Menarche: 56-16 years old Age at first live birth: 62 years old She is GXP2. Her LMP was in 2006. She used oral contraceptives for over 5 years with no complications. She also used HRT around 2005 for about a year until her initial breast cancer diagnosis in 2006.      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 She is also in the process of retiring. Her husband, [name], is retired from Investment banker, corporate. The patient's daughter, Mickel Baas, is a Cabin crew in [place], [place]. The patient's second daughter, Claiborne Billings, works in office and child care in [place]. She has no grandchildren.  She is a person of faith, but is private.      ADVANCED DIRECTIVES:    HEALTH MAINTENANCE: Social History   Tobacco Use  . Smoking status: Former Research scientist (life sciences)  . Smokeless tobacco: Never Used  Substance Use Topics  . Alcohol use: Yes    Comment: Occasional  . Drug use: Never     Colonoscopy: 2016/ in [place]  PAP: March 2017/ normal  Bone density: 2018 at Dr. Smith/ osteopenia    Allergies  Allergen Reactions  . Bupropion Other (See Comments)    DIZZINESS UNABLE TO STAND  . Chlorhexidine Gluconate   [Chlorhexidine] Rash    Developed rash over the chest, neck and underarms where was prepped with Chlorhexidine in the OR  . Tamoxifen Other (See Comments)    EXTREME FATIGUE HOT FLASHES SWEATING    Current Outpatient Medications  Medication Sig Dispense Refill  . anastrozole (ARIMIDEX) 1 MG tablet Take 1 tablet (1 mg total) by mouth daily. 90 tablet 4  . HYDROcodone-acetaminophen (NORCO/VICODIN) 5-325 MG tablet Take 1 tablet by mouth every 4 (four) hours as needed for moderate pain. 30 tablet 0  . NP THYROID 60 MG tablet Take 60 mg by mouth daily before breakfast.     . propranolol (INDERAL) 20 MG tablet Take 20 mg by mouth daily.     Marland Kitchen sulfamethoxazole-trimethoprim (BACTRIM DS,SEPTRA DS) 800-160 MG tablet Take 1 tablet by mouth 2 (two) times daily.     No current facility-administered medications for this visit.     OBJECTIVE: Middle-aged [place] woman who appears stated age  33:   05/11/18 1519  BP: (!) 172/111  Pulse: 71  Resp: 18  Temp: 97.8 F (36.6 C)  SpO2: 100%     Body mass index is 26.37 kg/m.   Wt Readings from Last 3 Encounters:  05/11/18 183 lb 12.8 oz (83.4 kg)  01/27/18 180 lb 3.2 oz (81.7 kg)  01/07/18 183 lb (83 kg)  ECOG FS:0 - Asymptomatic  Sclerae unicteric, EOMs intact No cervical or supraclavicular adenopathy Lungs no rales or rhonchi Heart regular rate and rhythm Abd soft, nontender, positive bowel sounds MSK no focal spinal tenderness, no upper extremity lymphedema Neuro: nonfocal, well oriented, appropriate affect Breasts: The right breast is status post mastectomy with silicone implant reconstruction.  The left breast is status post reduction mammoplasty.  There is no evidence of residual or recurrent disease.  Both axillae are benign.     LAB RESULTS:  CMP     Component Value Date/Time   NA 138 10/29/2017 1305   K 4.5 10/29/2017 1305   CL 102 10/29/2017 1305   CO2 26 10/29/2017 1305   GLUCOSE 95 10/29/2017 1305   BUN 14  10/29/2017 1305   CREATININE 0.88 10/29/2017 1305   CREATININE 0.92 08/30/2017 1145   CALCIUM 10.0 10/29/2017 1305   PROT 7.5 10/29/2017 1305   ALBUMIN 4.5 10/29/2017 1305   AST 25 10/29/2017 1305   AST 28 08/30/2017 1145   ALT 15 10/29/2017 1305   ALT 19 08/30/2017 1145   ALKPHOS 55 10/29/2017 1305   BILITOT 0.6 10/29/2017 1305   BILITOT 0.6 08/30/2017 1145   GFRNONAA >60 10/29/2017 1305   GFRNONAA >60 08/30/2017 1145   GFRAA >60 10/29/2017 1305   GFRAA >60 08/30/2017 1145    No results found for: TOTALPROTELP, ALBUMINELP, A1GS, A2GS, BETS, BETA2SER, GAMS, MSPIKE, SPEI  No results found for: KPAFRELGTCHN, LAMBDASER, KAPLAMBRATIO  Lab Results  Component Value Date   WBC 6.7 01/27/2018  NEUTROABS 4.1 01/27/2018   HGB 13.6 01/27/2018   HCT 41.8 01/27/2018   MCV 91.5 01/27/2018   PLT 245 01/27/2018    '@LASTCHEMISTRY'$ @  No results found for: LABCA2  No components found for: OYDXAJ287  No results for input(s): INR in the last 168 hours.  No results found for: LABCA2  No results found for: OMV672  No results found for: CNO709  No results found for: GGE366  No results found for: CA2729  No components found for: HGQUANT  No results found for: CEA1 / No results found for: CEA1   No results found for: AFPTUMOR  No results found for: CHROMOGRNA  No results found for: PSA1  No visits with results within 3 Day(s) from this visit.  Latest known visit with results is:  Admission on 01/27/2018, Discharged on 01/27/2018  Component Date Value Ref Range Status  . WBC 01/27/2018 6.7  4.0 - 10.5 K/uL Final  . RBC 01/27/2018 4.57  3.87 - 5.11 MIL/uL Final  . Hemoglobin 01/27/2018 13.6  12.0 - 15.0 g/dL Final  . HCT 01/27/2018 41.8  36.0 - 46.0 % Final  . MCV 01/27/2018 91.5  78.0 - 100.0 fL Final  . MCH 01/27/2018 29.8  26.0 - 34.0 pg Final  . MCHC 01/27/2018 32.5  30.0 - 36.0 g/dL Final  . RDW 01/27/2018 13.7  11.5 - 15.5 % Final  . Platelets 01/27/2018 245  150 -  400 K/uL Final  . Neutrophils Relative % 01/27/2018 62  % Final  . Neutro Abs 01/27/2018 4.1  1.7 - 7.7 K/uL Final  . Lymphocytes Relative 01/27/2018 27  % Final  . Lymphs Abs 01/27/2018 1.8  0.7 - 4.0 K/uL Final  . Monocytes Relative 01/27/2018 7  % Final  . Monocytes Absolute 01/27/2018 0.5  0.1 - 1.0 K/uL Final  . Eosinophils Relative 01/27/2018 3  % Final  . Eosinophils Absolute 01/27/2018 0.2  0.0 - 0.7 K/uL Final  . Basophils Relative 01/27/2018 1  % Final  . Basophils Absolute 01/27/2018 0.0  0.0 - 0.1 K/uL Final  . Immature Granulocytes 01/27/2018 0  % Final  . Abs Immature Granulocytes 01/27/2018 0.0  0.0 - 0.1 K/uL Final   Performed at [facility], [place] [address] [place], [place] [address]  . Specimen Description 01/27/2018 WOUND RIGHT BREAST SEROMA   Final  . Special Requests 01/27/2018 NONE   Final  . Gram Stain 01/27/2018    Final                   Value:RARE WBC PRESENT, PREDOMINANTLY MONONUCLEAR NO ORGANISMS SEEN   . Culture 01/27/2018    Final                   Value:No growth aerobically or anaerobically. Performed at [facility], [place] [address] [place], [place] [address]   . Report Status 01/27/2018 02/01/2018 FINAL   Final    (this displays the last labs from the last 3 days)  No results found for: TOTALPROTELP, ALBUMINELP, A1GS, A2GS, BETS, BETA2SER, GAMS, MSPIKE, SPEI (this displays SPEP labs)  No results found for: KPAFRELGTCHN, LAMBDASER, KAPLAMBRATIO (kappa/lambda light chains)  No results found for: HGBA, HGBA2QUANT, HGBFQUANT, HGBSQUAN (Hemoglobinopathy evaluation)   No results found for: LDH  No results found for: IRON, TIBC, IRONPCTSAT (Iron and TIBC)  No results found for: FERRITIN  Urinalysis No results found for: COLORURINE, APPEARANCEUR, LABSPEC, PHURINE, GLUCOSEU, HGBUR, BILIRUBINUR, KETONESUR, PROTEINUR, UROBILINOGEN, NITRITE, LEUKOCYTESUR  STUDIES: No results found.   ELIGIBLE FOR AVAILABLE RESEARCH PROTOCOL:  no  ASSESSMENT: 62 y.o. [place], [place] woman  (1) status post right lumpectomy and sentinel lymph node sampling 07/30/2004 for a pT1c pN0, stage IA invasive breast cancer, grade 1, estrogen and progesterone receptor positive, HER-2 not amplified, with an MIB-1 of 7%  (a) s/p adjuvant radiation  (b) intolerant of tamoxifen  (c) on aromasin for 5 years w/o complications  (2) status post right breast upper outer quadrant biopsy 08/17/2017 for a clinical T2 N0, stage IB invasive ductal carcinoma, grade 2, estrogen and progesterone receptor positive, HER-2 not amplified, with an MIB-1 of 25%  (a) a second area of concern measuring 0.4 cm was located in the upper inner quadrant  (3) right mastectomy on 09/27/2017 (Dr. Marlou Starks) showed multifocal invasive ductal carcinoma grade II, 2 tumors, 2.4cm and 0.5cm, margins negative, 2 SLN negative.  pT2, pN0, stage IB  (a) permanent implants (smooth high-profile silicone) placed 26/37/8588  (b) the right chest silicone implant had to be removed and replaced 01/27/2018 because of a persistent seroma  (4) Oncotype DX from 08/17/17: 24 (intermediate risk), predicting a 9-year risk of recurrence outside the breast of 10% if the patient's only systemic therapy is tamoxifen for 5 years.  It also predicts no significant benefit from chemotherapy.    (5) anastrozole started neoadjuvantly in anticipation of some surgical delays  (6) genetics testing on 09/08/2017 shows no deleterious mutations.  Genes tested include: APC, ATM, AXIN2, BARD1, BMPR1A, BRCA1, BRCA2, BRIP1, CDH1, CDK4, CDKN2A (p14ARF), CDKN2A (p16INK4a), CHEK2, CTNNA1, DICER1, EPCAM (Deletion/duplication testing only), GREM1 (promoter region deletion/duplication testing only), KIT, MEN1, MLH1, MSH2, MSH3, MSH6, MUTYH, NBN, NF1, NHTL1, PALB2, PDGFRA, PMS2, POLD1, POLE, PTEN, RAD50, RAD51C, RAD51D, SDHB, SDHC, SDHD, SMAD4, SMARCA4. STK11, TP53, TSC1, TSC2, and VHL.  The following genes were evaluated for sequence  changes only: SDHA and HOXB13 c.251G>A variant only.  (7) history of von Willebrand disease  PLAN:  Catherine Robbins is finally done with her surgeries and feels like she is back to her baseline.  It is wonderful to hear that she is able to run.  She had a rough year because of all her surgeries, but is settling down in terms of reconstruction and very likely will not have nipples tattooed.  She is tolerating the anastrozole well.  She does have some nighttime hot flashes and I think she would benefit from gabapentin.  I have gone ahead and written the prescription for her and she will let me know if she has any problems from that  Otherwise she will see Dr. Tamala Julian her primary care physician in March.  Catherine Robbins will return to see me in September and I will see her on a yearly basis thereafter until she completes her 5 years of follow-up  She knows to call for any other issues that may develop before that visit.  Magrinat, Virgie Dad, MD  05/11/18 3:44 PM Medical Oncology and Hematology [facility] [address] [place],  [address] Tel. [phone]    Fax. [phone]   I, Jacqualyn Posey am acting as a Education administrator for Chauncey Cruel, MD.   See med

## 2018-05-11 ENCOUNTER — Inpatient Hospital Stay: Payer: BC Managed Care – PPO | Attending: Oncology | Admitting: Oncology

## 2018-05-11 ENCOUNTER — Telehealth: Payer: Self-pay | Admitting: Oncology

## 2018-05-11 VITALS — BP 172/111 | HR 71 | Temp 97.8°F | Resp 18 | Ht 70.0 in | Wt 183.8 lb

## 2018-05-11 DIAGNOSIS — I10 Essential (primary) hypertension: Secondary | ICD-10-CM | POA: Insufficient documentation

## 2018-05-11 DIAGNOSIS — Z170 Estrogen receptor positive status [ER+]: Secondary | ICD-10-CM | POA: Diagnosis not present

## 2018-05-11 DIAGNOSIS — Z87891 Personal history of nicotine dependence: Secondary | ICD-10-CM | POA: Diagnosis not present

## 2018-05-11 DIAGNOSIS — C50411 Malignant neoplasm of upper-outer quadrant of right female breast: Secondary | ICD-10-CM | POA: Diagnosis not present

## 2018-05-11 DIAGNOSIS — Z923 Personal history of irradiation: Secondary | ICD-10-CM | POA: Diagnosis not present

## 2018-05-11 DIAGNOSIS — D680 Von Willebrand's disease: Secondary | ICD-10-CM | POA: Diagnosis not present

## 2018-05-11 DIAGNOSIS — Z9011 Acquired absence of right breast and nipple: Secondary | ICD-10-CM | POA: Insufficient documentation

## 2018-05-11 DIAGNOSIS — Z79899 Other long term (current) drug therapy: Secondary | ICD-10-CM | POA: Diagnosis not present

## 2018-05-11 DIAGNOSIS — Z79811 Long term (current) use of aromatase inhibitors: Secondary | ICD-10-CM | POA: Diagnosis not present

## 2018-05-11 MED ORDER — GABAPENTIN 300 MG PO CAPS: 300.0000 mg | ORAL_CAPSULE | Freq: Every day | ORAL | 4 refills | Status: DC

## 2018-05-11 NOTE — Telephone Encounter (Signed)
Gave avs and calendar ° °

## 2018-05-11 NOTE — Addendum Note (Signed)
Addended by: Chauncey Cruel on: 05/11/2018 03:49 PM   Modules accepted: Orders

## 2018-10-15 ENCOUNTER — Encounter (HOSPITAL_COMMUNITY): Payer: Self-pay | Admitting: *Deleted

## 2018-10-15 ENCOUNTER — Ambulatory Visit (HOSPITAL_COMMUNITY): Payer: BC Managed Care – PPO | Admitting: Certified Registered"

## 2018-10-15 ENCOUNTER — Encounter (HOSPITAL_COMMUNITY)
Admission: RE | Disposition: A | Discharge status: Home / Self Care | Payer: Self-pay | Source: Home / Self Care | Attending: Plastic Surgery

## 2018-10-15 ENCOUNTER — Other Ambulatory Visit: Payer: Self-pay

## 2018-10-15 ENCOUNTER — Ambulatory Visit (HOSPITAL_COMMUNITY)
Admission: RE | Admit: 2018-10-15 | Discharge: 2018-10-15 | Disposition: A | Discharge status: Home / Self Care | Payer: BC Managed Care – PPO | Attending: Plastic Surgery | Admitting: Plastic Surgery

## 2018-10-15 DIAGNOSIS — L03313 Cellulitis of chest wall: Secondary | ICD-10-CM | POA: Insufficient documentation

## 2018-10-15 DIAGNOSIS — Z1159 Encounter for screening for other viral diseases: Secondary | ICD-10-CM | POA: Diagnosis not present

## 2018-10-15 DIAGNOSIS — Z170 Estrogen receptor positive status [ER+]: Secondary | ICD-10-CM | POA: Diagnosis not present

## 2018-10-15 DIAGNOSIS — Z9011 Acquired absence of right breast and nipple: Secondary | ICD-10-CM | POA: Insufficient documentation

## 2018-10-15 DIAGNOSIS — I10 Essential (primary) hypertension: Secondary | ICD-10-CM | POA: Insufficient documentation

## 2018-10-15 DIAGNOSIS — Z87891 Personal history of nicotine dependence: Secondary | ICD-10-CM | POA: Insufficient documentation

## 2018-10-15 DIAGNOSIS — C50911 Malignant neoplasm of unspecified site of right female breast: Secondary | ICD-10-CM | POA: Diagnosis not present

## 2018-10-15 DIAGNOSIS — Z923 Personal history of irradiation: Secondary | ICD-10-CM | POA: Diagnosis not present

## 2018-10-15 HISTORY — PX: BREAST IMPLANT REMOVAL: SHX5361

## 2018-10-15 HISTORY — DX: Hypothyroidism, unspecified: E03.9

## 2018-10-15 HISTORY — DX: Essential (primary) hypertension: I10

## 2018-10-15 LAB — BASIC METABOLIC PANEL
Anion gap: 10 (ref 5–15)
BUN: 10 mg/dL (ref 8–23)
CO2: 22 mmol/L (ref 22–32)
Calcium: 9.9 mg/dL (ref 8.9–10.3)
Chloride: 103 mmol/L (ref 98–111)
Creatinine, Ser: 0.95 mg/dL (ref 0.44–1.00)
GFR calc Af Amer: >60 mL/min (ref >60)
GFR calc non Af Amer: >60 mL/min (ref >60)
Glucose, Bld: 95 mg/dL (ref 70–99)
Potassium: 4.6 mmol/L (ref 3.5–5.1)
Sodium: 135 mmol/L (ref 135–145)

## 2018-10-15 LAB — CBC WITH DIFFERENTIAL/PLATELET
Abs Immature Granulocytes: 0.05 10*3/uL (ref 0.00–0.07)
Basophils Absolute: 0 10*3/uL (ref 0.0–0.1)
Basophils Relative: 0 %
Eosinophils Absolute: 0.1 10*3/uL (ref 0.0–0.5)
Eosinophils Relative: 1 %
HCT: 41.5 % (ref 36.0–46.0)
Hemoglobin: 13.7 g/dL (ref 12.0–15.0)
Immature Granulocytes: 0 %
Lymphocytes Relative: 15 %
Lymphs Abs: 1.9 10*3/uL (ref 0.7–4.0)
MCH: 30.2 pg (ref 26.0–34.0)
MCHC: 33 g/dL (ref 30.0–36.0)
MCV: 91.4 fL (ref 80.0–100.0)
Monocytes Absolute: 1.1 10*3/uL — ABNORMAL HIGH (ref 0.1–1.0)
Monocytes Relative: 9 %
Neutro Abs: 9.1 10*3/uL — ABNORMAL HIGH (ref 1.7–7.7)
Neutrophils Relative %: 75 %
Platelets: 195 10*3/uL (ref 150–400)
RBC: 4.54 MIL/uL (ref 3.87–5.11)
RDW: 13.2 % (ref 11.5–15.5)
WBC: 12.3 10*3/uL — ABNORMAL HIGH (ref 4.0–10.5)
nRBC: 0 % (ref 0.0–0.2)

## 2018-10-15 LAB — SARS CORONAVIRUS 2 BY RT PCR (HOSPITAL ORDER, PERFORMED IN ~~LOC~~ HOSPITAL LAB): SARS Coronavirus 2: NEGATIVE

## 2018-10-15 SURGERY — REMOVAL, IMPLANT, BREAST
Anesthesia: General | Site: Breast | Laterality: Right

## 2018-10-15 MED ORDER — FENTANYL CITRATE (PF) 250 MCG/5ML IJ SOLN
INTRAMUSCULAR | Status: AC
  Filled 2018-10-15: qty 5

## 2018-10-15 MED ORDER — ONDANSETRON HCL 4 MG/2ML IJ SOLN
INTRAMUSCULAR | Status: DC | PRN
  Administered 2018-10-15: 4 mg via INTRAVENOUS

## 2018-10-15 MED ORDER — SODIUM CHLORIDE 0.9 % IV SOLN
Freq: Once | INTRAVENOUS | Status: DC
  Filled 2018-10-15: qty 1

## 2018-10-15 MED ORDER — VANCOMYCIN HCL IN DEXTROSE 1-5 GM/200ML-% IV SOLN
1000.0000 mg | INTRAVENOUS | Status: AC
  Administered 2018-10-15: 1000 mg via INTRAVENOUS
  Filled 2018-10-15: qty 200

## 2018-10-15 MED ORDER — MIDAZOLAM HCL 2 MG/2ML IJ SOLN
INTRAMUSCULAR | Status: AC
  Filled 2018-10-15: qty 2

## 2018-10-15 MED ORDER — HYDROCODONE-ACETAMINOPHEN 5-325 MG PO TABS: 1.0000 | ORAL_TABLET | ORAL | 0 refills | Status: DC | PRN

## 2018-10-15 MED ORDER — MIDAZOLAM HCL 5 MG/5ML IJ SOLN
INTRAMUSCULAR | Status: DC | PRN
  Administered 2018-10-15: 2 mg via INTRAVENOUS

## 2018-10-15 MED ORDER — PROPOFOL 10 MG/ML IV BOLUS
INTRAVENOUS | Status: AC
  Filled 2018-10-15: qty 20

## 2018-10-15 MED ORDER — HYDROCODONE-ACETAMINOPHEN 5-325 MG PO TABS
ORAL_TABLET | ORAL | Status: AC
  Administered 2018-10-15: 1
  Filled 2018-10-15: qty 1

## 2018-10-15 MED ORDER — LACTATED RINGERS IV SOLN
INTRAVENOUS | Status: DC
  Administered 2018-10-15: 15:00:00 via INTRAVENOUS

## 2018-10-15 MED ORDER — SODIUM CHLORIDE 0.9 % IV SOLN
INTRAVENOUS | Status: DC | PRN
  Administered 2018-10-15: 17:00:00 1000 mL

## 2018-10-15 MED ORDER — LIDOCAINE HCL (CARDIAC) PF 100 MG/5ML IV SOSY
PREFILLED_SYRINGE | INTRAVENOUS | Status: DC | PRN
  Administered 2018-10-15: 100 mg via INTRAVENOUS

## 2018-10-15 MED ORDER — ACETAMINOPHEN 10 MG/ML IV SOLN
INTRAVENOUS | Status: DC | PRN
  Administered 2018-10-15: 1000 mg via INTRAVENOUS

## 2018-10-15 MED ORDER — 0.9 % SODIUM CHLORIDE (POUR BTL) OPTIME
TOPICAL | Status: DC | PRN
  Administered 2018-10-15: 3000 mL

## 2018-10-15 MED ORDER — ACETAMINOPHEN 10 MG/ML IV SOLN
INTRAVENOUS | Status: AC
  Filled 2018-10-15: qty 100

## 2018-10-15 MED ORDER — PROPOFOL 10 MG/ML IV BOLUS
INTRAVENOUS | Status: DC | PRN
  Administered 2018-10-15: 170 mg via INTRAVENOUS

## 2018-10-15 MED ORDER — FENTANYL CITRATE (PF) 100 MCG/2ML IJ SOLN
INTRAMUSCULAR | Status: DC | PRN
  Administered 2018-10-15: 50 ug via INTRAVENOUS

## 2018-10-15 SURGICAL SUPPLY — 46 items
ADH SKN CLS APL DERMABOND .7 (GAUZE/BANDAGES/DRESSINGS) ×1
BAG DECANTER FOR FLEXI CONT (MISCELLANEOUS) ×3 IMPLANT
BINDER BREAST LRG (GAUZE/BANDAGES/DRESSINGS) IMPLANT
BINDER BREAST XLRG (GAUZE/BANDAGES/DRESSINGS) ×2 IMPLANT
BLADE 10 SAFETY STRL DISP (BLADE) ×2 IMPLANT
CANISTER SUCT 3000ML PPV (MISCELLANEOUS) ×3 IMPLANT
CHLORAPREP W/TINT 26ML (MISCELLANEOUS) ×3 IMPLANT
COVER SURGICAL LIGHT HANDLE (MISCELLANEOUS) ×3 IMPLANT
COVER WAND RF STERILE (DRAPES) ×3 IMPLANT
DERMABOND ADVANCED (GAUZE/BANDAGES/DRESSINGS) ×2
DERMABOND ADVANCED .7 DNX12 (GAUZE/BANDAGES/DRESSINGS) ×1 IMPLANT
DRAIN CHANNEL 15F RND FF W/TCR (WOUND CARE) ×3 IMPLANT
DRAIN CHANNEL 19F RND (DRAIN) ×5 IMPLANT
DRAPE ORTHO SPLIT 77X108 STRL (DRAPES) ×6
DRAPE SURG ORHT 6 SPLT 77X108 (DRAPES) ×2 IMPLANT
DRAPE WARM FLUID 44X44 (DRAPE) ×3 IMPLANT
DRSG PAD ABDOMINAL 8X10 ST (GAUZE/BANDAGES/DRESSINGS) ×6 IMPLANT
ELECT BLADE 4.0 EZ CLEAN MEGAD (MISCELLANEOUS)
ELECT REM PT RETURN 9FT ADLT (ELECTROSURGICAL) ×3
ELECTRODE BLDE 4.0 EZ CLN MEGD (MISCELLANEOUS) IMPLANT
ELECTRODE REM PT RTRN 9FT ADLT (ELECTROSURGICAL) ×1 IMPLANT
EVACUATOR SILICONE 100CC (DRAIN) ×5 IMPLANT
GAUZE SPONGE 4X4 12PLY STRL (GAUZE/BANDAGES/DRESSINGS) ×3 IMPLANT
GLOVE BIO SURGEON STRL SZ 6 (GLOVE) ×3 IMPLANT
GLOVE SURG SS PI 6.0 STRL IVOR (GLOVE) ×3 IMPLANT
GOWN STRL REUS W/ TWL LRG LVL3 (GOWN DISPOSABLE) ×2 IMPLANT
GOWN STRL REUS W/TWL LRG LVL3 (GOWN DISPOSABLE) ×6
KIT BASIN OR (CUSTOM PROCEDURE TRAY) ×3 IMPLANT
KIT TURNOVER KIT B (KITS) ×3 IMPLANT
NS IRRIG 1000ML POUR BTL (IV SOLUTION) ×6 IMPLANT
PACK GENERAL/GYN (CUSTOM PROCEDURE TRAY) ×3 IMPLANT
PAD ABD 7.5X8 STRL (GAUZE/BANDAGES/DRESSINGS) ×2 IMPLANT
PAD ARMBOARD 7.5X6 YLW CONV (MISCELLANEOUS) ×3 IMPLANT
PIN SAFETY STERILE (MISCELLANEOUS) ×3 IMPLANT
SOL PREP POV-IOD 4OZ 10% (MISCELLANEOUS) IMPLANT
STAPLER VISISTAT 35W (STAPLE) ×3 IMPLANT
SUT CHROMIC 4 0 PS 2 18 (SUTURE) IMPLANT
SUT ETHILON 2 0 FS 18 (SUTURE) ×3 IMPLANT
SUT MNCRL AB 4-0 PS2 18 (SUTURE) ×3 IMPLANT
SUT VIC AB 0 CT2 27 (SUTURE) IMPLANT
SUT VIC AB 3-0 PS2 18 (SUTURE) ×3
SUT VIC AB 3-0 PS2 18XBRD (SUTURE) ×1 IMPLANT
SUT VIC AB 4-0 PS2 27 (SUTURE) ×3 IMPLANT
TOWEL OR 17X24 6PK STRL BLUE (TOWEL DISPOSABLE) ×3 IMPLANT
TOWEL OR 17X26 10 PK STRL BLUE (TOWEL DISPOSABLE) ×3 IMPLANT
TRAY FOLEY MTR SLVR 14FR STAT (SET/KITS/TRAYS/PACK) IMPLANT

## 2018-10-15 NOTE — Transfer of Care (Signed)
Immediate Anesthesia Transfer of Care Note  Patient: Catherine Robbins  Procedure(s) Performed: REMOVAL BREAST IMPLANTS (Right Breast)  Patient Location: PACU  Anesthesia Type:General  Level of Consciousness: awake, alert , oriented and patient cooperative  Airway & Oxygen Therapy: Patient Spontanous Breathing and Patient connected to nasal cannula oxygen  Post-op Assessment: Report given to RN and Post -op Vital signs reviewed and stable  Post vital signs: Reviewed and stable  Last Vitals:  Vitals Value Taken Time  BP 152/86 10/15/2018  5:25 PM  Temp    Pulse 55 10/15/2018  5:26 PM  Resp 16 10/15/2018  5:26 PM  SpO2 100 % 10/15/2018  5:26 PM  Vitals shown include unvalidated device data.  Last Pain:  Vitals:   10/15/18 1451  TempSrc: Oral  PainSc: 7       Patients Stated Pain Goal: 4 (99/41/29 0475)  Complications: No apparent anesthesia complications

## 2018-10-15 NOTE — Progress Notes (Signed)
Documentation error: Patient not given 6 CHG wipes on arrival due to allergy as initially documented.  When starting IV I initially cleaned site with CHG, realized that patient was allergic, immediately flushed site with water (left hand), and reprepped with alcohol prep. No rash or burning noted.

## 2018-10-15 NOTE — H&P (Signed)
Subjective:     Patient ID: Catherine Robbins is a 63 y.o. female.  HPI  9 months weeks post op silicone implant exchange right chest, drain placement. Culture no growth. Onset cellulitis 3 days ago with fever, HA.  Started on Bactrim, reports increasing cellulitis and fever over 101 F.  History significant for IDC right breast and underwent lumpectomy in 2006 with adjuvant radiation.  Exemestane for 5 years.  Screening [facility] 08/11/2017 there was an abnormality in the right breast.  US showed new 2.6 cm irregular mass at the 11 o'clock right UOQ. Biopsy IDC, ER/PR+ Her2-.  Underwent mastectomy with SLN with final pathology multifocal IDC, 2.4 and 0.5 cm, margins clear, 0/2 SLN. Underwent immediate reconstruction with TE/Flex HD with Dr. Marla Roe 08/2017. Course complicated by recurrent seroma post two aspirations. She developed low grade temp and elected for implant exchange, drain.  Genetics were negative.   Prior to mastectomy 38 C. Right mastectomy 691 g. Left reduction 350 g.  Review of Systems     Objective:   Physical Exam CV: normal heart sounds PULM: normal breath sounds Chest: right chest erythema present Axilla no masses    Assessment:     History right breast cancer s/p lumpectomy, radiation History recurrent right breast cancer s/p mastectomy, immediate TE/ADM (Flex HD) dual plane reconstruction S/p right silicone implant exchange, left breast reduction Recurrent seroma right chest- s/p exchange silicone implant, drain placement Cellulitis right chest recurrent    Plan:      Continued fevers, redness chest on oral antibiotics. Plan removal right chest implant, drain placement today. Pending intraoperative findings, plan OP surgery.  Instructed on NPO now, presentation for admission to [facility] ED, and will receive COVID testing preoperatively.  Irene Limbo, MD [facility] Plastic & Reconstructive Surgery [phone], pin [phone]

## 2018-10-15 NOTE — Anesthesia Postprocedure Evaluation (Signed)
Anesthesia Post Note  Patient: Catherine Robbins  Procedure(s) Performed: REMOVAL BREAST IMPLANTS (Right Breast)     Patient location during evaluation: PACU Anesthesia Type: General Level of consciousness: awake and alert Pain management: pain level controlled Vital Signs Assessment: post-procedure vital signs reviewed and stable Respiratory status: spontaneous breathing, nonlabored ventilation, respiratory function stable and patient connected to nasal cannula oxygen Cardiovascular status: blood pressure returned to baseline and stable Postop Assessment: no apparent nausea or vomiting Anesthetic complications: no    Last Vitals:  Vitals:   10/15/18 1740 10/15/18 1755  BP: (!) 161/88 (!) 162/80  Pulse: (!) 50 (!) 56  Resp: 12 14  Temp:    SpO2: 100% 100%    Last Pain:  Vitals:   10/15/18 1755  TempSrc:   PainSc: 5                  Xai Frerking COKER

## 2018-10-15 NOTE — Op Note (Signed)
Operative Note   DATE OF OPERATION: 5.16.20  LOCATION: [facility] OR-outpatient  SURGICAL DIVISION: Plastic Surgery  PREOPERATIVE DIAGNOSES:  1. Cellulitis right chest 2. Acquired absence right chest 3. History breast cancer 4. History therapeutic radiation  POSTOPERATIVE DIAGNOSES:  same  PROCEDURE:  Removal right breast implant  SURGEON: Irene Limbo MD MBA  ASSISTANT: none  ANESTHESIA:  General.   EBL: 50 ml  COMPLICATIONS: None immediate.   INDICATIONS FOR PROCEDURE:  The patient, Catherine Robbins, is a 63 y.o. female born on 15-May-1956, is here for removal right breast implant. She has a history of implant based breast reconstruction and has developed cellulitis and fever.   FINDINGS: Removed intact smooth round silicone implant. Cavity with moderate amount cloudy tan fluid.  DESCRIPTION OF PROCEDURE:  The patient's operative site was marked with the patient in the preoperative area. The patient was taken to the operating room. SCDs were placed and IV antibiotics were given. The patient's operative site was prepped and draped in a sterile fashion. A time out was performed and all information was confirmed to be correct. Incision made in prior transverse chest mastectomy scar and carried through to implant capsule. Cultures obtained of fluid. Implant removed. Curettage performed over entirety cavity. Small amount acellular dermis noted and this was well adherent to mastectomy flap. Cavity irrigated with Betadine saline solution and saline solution containing Ancef, gentamicin, and bacitracin. 19 Fr drain placed percutaneously and secured with 2-0 nylon. Closure completed with 3-0 monocryl in superficial fascia, 3-0 monocryl in dermis, 4-0 monocryl subcuticular closure. Dermabond applied followed by dry dressing, breast binder.  The patient was allowed to wake from anesthesia, extubated and taken to the recovery room in satisfactory condition.   SPECIMENS: cultures  DRAINS: 19  Fr JP in right chest

## 2018-10-15 NOTE — Anesthesia Preprocedure Evaluation (Signed)
Anesthesia Evaluation  Patient identified by MRN, date of birth, ID band Patient awake    Reviewed: Allergy & Precautions, NPO status , Patient's Chart, lab work & pertinent test results  Airway Mallampati: II  TM Distance: >3 FB Neck ROM: Full    Dental  (+) Teeth Intact, Dental Advisory Given   Pulmonary former smoker,    breath sounds clear to auscultation       Cardiovascular hypertension,  Rhythm:Regular Rate:Normal     Neuro/Psych    GI/Hepatic   Endo/Other    Renal/GU      Musculoskeletal   Abdominal   Peds  Hematology   Anesthesia Other Findings   Reproductive/Obstetrics                             Anesthesia Physical Anesthesia Plan  ASA: II  Anesthesia Plan: General   Post-op Pain Management:    Induction: Intravenous  PONV Risk Score and Plan: Ondansetron and Dexamethasone  Airway Management Planned: LMA  Additional Equipment:   Intra-op Plan:   Post-operative Plan:   Informed Consent: I have reviewed the patients History and Physical, chart, labs and discussed the procedure including the risks, benefits and alternatives for the proposed anesthesia with the patient or authorized representative who has indicated his/her understanding and acceptance.     Dental advisory given  Plan Discussed with: CRNA and Anesthesiologist  Anesthesia Plan Comments:         Anesthesia Quick Evaluation

## 2018-10-16 ENCOUNTER — Encounter (HOSPITAL_COMMUNITY): Payer: Self-pay | Admitting: Plastic Surgery

## 2018-10-20 LAB — AEROBIC/ANAEROBIC CULTURE W GRAM STAIN (SURGICAL/DEEP WOUND): Gram Stain: NONE SEEN

## 2018-10-28 ENCOUNTER — Other Ambulatory Visit: Payer: Self-pay | Admitting: Oncology

## 2019-01-28 ENCOUNTER — Other Ambulatory Visit: Payer: Self-pay | Admitting: Oncology

## 2019-02-06 ENCOUNTER — Other Ambulatory Visit: Payer: Self-pay | Admitting: Oncology

## 2019-02-13 ENCOUNTER — Telehealth: Payer: Self-pay | Admitting: Oncology

## 2019-02-13 NOTE — Telephone Encounter (Signed)
Returned patient's phone call regarding rescheduling 09/15 appointments, spoke with patient's husband and patient is currently not home. Left a number for the patient to reach.

## 2019-02-13 NOTE — Progress Notes (Signed)
No show

## 2019-02-14 ENCOUNTER — Inpatient Hospital Stay (HOSPITAL_BASED_OUTPATIENT_CLINIC_OR_DEPARTMENT_OTHER): Payer: BC Managed Care – PPO | Admitting: Oncology

## 2019-02-14 ENCOUNTER — Encounter: Payer: Self-pay | Admitting: Oncology

## 2019-02-14 ENCOUNTER — Inpatient Hospital Stay: Payer: BC Managed Care – PPO | Attending: Oncology

## 2019-02-14 DIAGNOSIS — Z170 Estrogen receptor positive status [ER+]: Secondary | ICD-10-CM

## 2019-02-14 DIAGNOSIS — C50411 Malignant neoplasm of upper-outer quadrant of right female breast: Secondary | ICD-10-CM

## 2019-02-14 DIAGNOSIS — Z171 Estrogen receptor negative status [ER-]: Secondary | ICD-10-CM

## 2019-03-31 ENCOUNTER — Telehealth: Payer: Self-pay | Admitting: Oncology

## 2019-03-31 NOTE — Telephone Encounter (Signed)
Returned patient's phone call regarding rescheduling an appointment, informed patient I will need further confirmation from provider and she will receive a call back.

## 2019-04-03 ENCOUNTER — Telehealth: Payer: Self-pay | Admitting: Oncology

## 2019-04-03 NOTE — Telephone Encounter (Signed)
Called patient to reschedule an appointment, left a voicemail.

## 2019-04-04 ENCOUNTER — Telehealth: Payer: Self-pay | Admitting: Oncology

## 2019-04-04 NOTE — Telephone Encounter (Signed)
Returned patient's phone call regarding rescheduling an appointment, per patient's request appointment has been moved to 11/12.

## 2019-04-12 NOTE — Progress Notes (Signed)
No show

## 2019-04-13 ENCOUNTER — Inpatient Hospital Stay: Payer: BC Managed Care – PPO | Attending: Oncology | Admitting: Oncology

## 2019-04-13 ENCOUNTER — Encounter: Payer: Self-pay | Admitting: Oncology

## 2019-04-13 DIAGNOSIS — C50411 Malignant neoplasm of upper-outer quadrant of right female breast: Secondary | ICD-10-CM

## 2019-04-13 DIAGNOSIS — Z170 Estrogen receptor positive status [ER+]: Secondary | ICD-10-CM

## 2019-04-18 ENCOUNTER — Telehealth: Payer: Self-pay | Admitting: Oncology

## 2019-04-18 NOTE — Telephone Encounter (Signed)
R/s appt per 11/12 sch message - pt aware of new appt date and time

## 2019-05-15 NOTE — Progress Notes (Signed)
Catherine Robbins  Telephone:([phone] Fax:([phone]) [phone]     ID: Catherine Robbins DOB: 1955-12-22  MR#: [id number]  KPT#:[id number]  Patient Care Team: Carol Ada, MD as PCP - General (Family Medicine) Jovita Kussmaul, MD as Consulting Physician (General Surgery) Dandra Shambaugh, Virgie Dad, MD as Consulting Physician (Oncology) Kyung Rudd, MD as Consulting Physician (Radiation Oncology) Milly Jakob, MD as Consulting Physician (Orthopedic Surgery) OTHER MD:  I connected with Angelica Pou on 05/16/19 at  2:00 PM EST by telephone visit and verified that I am speaking with the correct person using two identifiers.   Other persons participating in the visit and their role in the encounter: none  Patient's location: home  Provider's location: [place]: Estrogen receptor positive breast cancer  CURRENT TREATMENT: anastrozole   INTERVAL HISTORY: Catherine Robbins was contacted today for follow-up of her estrogen receptor positive breast cancer.  The patient continues on anastrozole.  She is having some nighttime hot flashes for which she takes gabapentin with variable results.  She tells me she likes to sleep in a very cold room which is very easy in the winter and that helps.  She is also having significant problems with vaginal dryness.  She has looked into some of the over-the-counter medications that are available for this and has discussed it with her gynecologist but those are fairly expensive.  We did discuss the pelvic health program that we have here but right now during the pandemic it is probably best to wait until everybody has had their vaccine before reconsidering that possibility.  Her last bone density screening was on 09/12/2015 at [facility] showing a T-score of -2.1, which is considered osteopenic.  She developed cellulitis and fever and underwent removal of her right breast implant on 10/15/2018 under Dr.  Iran Planas.  She also underwent left screening mammogram at [facility] on 11/22/2018, which showed: breast density category A; no evidence of malignancy.   REVIEW OF SYSTEMS: Catherine Robbins has an excellent exercise program, going at least 2-1/2 miles at least 5 days a week.  [facility] now and she finds it impossible to do cardio with a mask on, which is understandable.  She and her husband are keeping appropriate pandemic precautions.  He is currently not working outside the home.  Detailed review of systems was otherwise noncontributory   HISTORY OF CURRENT ILLNESS: From the original intake note:  Catherine Robbins has a history of prior right-sided breast cancer, status post lumpectomy on 07/30/2004 for a 1.1 cm invasive ductal carcinoma, grade 1, with negative margins and a single sentinel lymph node clear.  The tumor was estrogen receptor 93% positive, progesterone receptor 83% positive, with an MIB-1 of 7% and no HER-2 amplification ([id number]).  She received adjuvant radiation radiation. She took tamoxifen for only a month due to poor tolerance, including severe hot flashes. She was switched to exemestane and tolerated this well, taking it for a total of 5 years  More recently she had routine screening mammography on 08/11/2017 showing a possible abnormality in the right breast. She underwent unilateral right diagnostic mammography with tomography and right breast ultrasonography at [facility] on 08/13/2017 showing: breast density category A. By ultrasound, the new 2.6 cm irregular mass in the 11 o'clock right upper outer quadrant  posterior depth located 4 cm from the nipple is likely carcinoma. Additionally, there is a 0.4 cm irregular mass in the  right upper outer quadrant anterior depth and 3 cm from the nipple. These masses correlated to mammography findings. Evaluation of the right axilla was unremarkable.  Accordingly on 08/17/2017 she proceeded to biopsy of the  right breast mass in question. The pathology from this procedure showed ([id number]): At the 11 o'clock 4 cmfn, invasive ductal carcinoma, grade II. Prognostic indicators significant for: estrogen receptor, 90% positive and progesterone receptor, 100% positive, both with strong staining intensity. Proliferation marker Ki67 at 25%. HER2 not amplified with ratios HER2/CEP17 signals 1.28 and average HER2 copies per cell 2.09.   The patient's subsequent history is as detailed below.   PAST MEDICAL HISTORY: Past Medical History:  Diagnosis Date  . Breast cancer in female [employer])    Right  . Cancer ([place])   . 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 Hypertension   . Hypothyroidism   . Low sodium levels   . Lyme disease   . Von Willebrand disease ([place])     HTN, HLD, Von Willebrand's Disease, Lyme Disease and Bartonella (chronic since 1998) neuropathy in the feet secondary to the Lyme disease 2004. She is a DES Daughter.    PAST SURGICAL HISTORY: Past Surgical History:  Procedure Laterality Date  . BREAST IMPLANT REMOVAL Right 01/27/2018   Procedure: REMOVE AND REPLACE  BREAST IMPLANT - WITH DRAIN PLACEMENT;  Surgeon: Irene Limbo, MD;  Location: [place];  Service: Plastics;  Laterality: Right;  . BREAST IMPLANT REMOVAL Right 10/15/2018   Procedure: REMOVAL BREAST IMPLANTS;  Surgeon: Irene Limbo, MD;  Location: [place];  Service: Plastics;  Laterality: Right;  . BREAST LUMPECTOMY Right   . BREAST RECONSTRUCTION WITH PLACEMENT OF TISSUE EXPANDER AND FLEX HD (ACELLULAR HYDRATED DERMIS) Right 09/27/2017   Procedure: RIGHT BREAST RECONSTRUCTION WITH PLACEMENT OF TISSUE EXPANDER AND FLEX HD (ACELLULAR HYDRATED DERMIS);  Surgeon: Wallace Going, DO;  Location: [place];  Service: Plastics;  Laterality: Right;  . BREAST REDUCTION SURGERY Left 01/07/2018   Procedure: breast reduction/mastopexy for symmetry left;  Surgeon: Wallace Going, DO;  Location: [place];   Service: Plastics;  Laterality: Left;  Marland Kitchen MASTECTOMY W/ SENTINEL NODE BIOPSY Right 09/27/2017   Procedure: RIGHT MASTECTOMY WITH SENTINEL LYMPH NODE BIOPSY;  Surgeon: Jovita Kussmaul, MD;  Location: [place];  Service: General;  Laterality: Right;  . REMOVAL OF TISSUE EXPANDER AND PLACEMENT OF IMPLANT Right 01/07/2018   Procedure: REMOVAL OF TISSUE EXPANDER AND PLACEMENT OF IMPLANT;  Surgeon: Wallace Going, DO;  Location: [place];  Service: Plastics;  Laterality: Right;  . TONSILLECTOMY    Wisdom teeth extraction in the 55's    FAMILY HISTORY Family History  Problem Relation Age of Onset  . DES usage Mother   . Other Brother        DES exposure  . Heart disease Maternal Grandfather        d. 67  . Other Sister        DES exposure    The patient's father is alive at age 82. The patient's mother died at age 32 from heart disease. The patient has 1 brother and 2 sisters. She denies a history of breast, ovarian, or any other cancers in the family.    GYNECOLOGIC HISTORY:  No LMP recorded. Patient is postmenopausal. Menarche: 49-62 years old Age at first live birth: 63 years old She is GXP2. Her LMP was in 2006. She used oral contraceptives for over 5 years with no complications. She  also used HRT around 2005 for about a year until her initial breast cancer diagnosis in 2006.      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 She is also in the process of retiring. Her husband, [name], is retired from Investment banker, corporate. The patient's daughter, Mickel Baas, is a Cabin crew in [place], [place]. The patient's second daughter, Claiborne Billings, works in office and child care in [place]. She has no grandchildren. She is a person of faith, but is private.    ADVANCED DIRECTIVES: The absence of any documents to the contrary the patient's husband is her healthcare power  of attorney   HEALTH MAINTENANCE: Social History   Tobacco Use  . Smoking status: Former Research scientist (life sciences)  . Smokeless tobacco: Never Used  Substance Use Topics  . Alcohol use: Yes    Comment: Occasional  . Drug use: Never     Colonoscopy: 2016/ in [place]  PAP: March 2017/ normal  Bone density: 2018 at Dr. Smith/ osteopenia    Allergies  Allergen Reactions  . Bupropion Other (See Comments)    DIZZINESS UNABLE TO STAND  . Chlorhexidine Gluconate  [Chlorhexidine] Rash    Developed rash over the chest, neck and underarms where was prepped with Chlorhexidine in the OR  . Tamoxifen Other (See Comments)    EXTREME FATIGUE HOT FLASHES SWEATING    Current Outpatient Medications  Medication Sig Dispense Refill  . anastrozole (ARIMIDEX) 1 MG tablet Take 1 tablet by mouth once daily 90 tablet 0  . Ascorbic Acid (VITAMIN C PO) Take by mouth.    Marland Kitchen CALCIUM PO Take by mouth.    . Cyanocobalamin (VITAMIN B-12 PO) Take by mouth.    . gabapentin (NEURONTIN) 300 MG capsule Take 1 capsule (300 mg total) by mouth at bedtime. 90 capsule 4  . HYDROcodone-acetaminophen (NORCO) 5-325 MG tablet Take 1 tablet by mouth every 4 (four) hours as needed for moderate pain. 20 tablet 0  . ibuprofen (IBU-200) 200 MG tablet Take 800 mg by mouth as needed (Reports taking sporadic and taking between 200- 800 mg at a time).    . Misc Natural Products (GLUCOSAMINE CHOND COMPLEX/MSM PO) Take by mouth.    . Multiple Vitamins-Minerals (MULTIVITAMIN ADULT PO) Take by mouth.    . NP THYROID 60 MG tablet Take 60 mg by mouth daily before breakfast.     . propranolol (INDERAL) 20 MG tablet Take 20 mg by mouth daily.     . Sulfamethoxazole-Trimethoprim (BACTRIM PO) Take 1 tablet by mouth 2 (two) times daily.    Marland Kitchen VITAMIN D PO Take by mouth.    . Zinc Acetate, Oral, (ZINC ACETATE PO) Take by mouth.     No current facility-administered medications for this visit.    OBJECTIVE: Middle-aged [place] woman   There were no  vitals filed for this visit.   There is no height or weight on file to calculate BMI.   Wt Readings from Last 3 Encounters:  10/15/18 180 lb (81.6 kg)  05/11/18 183 lb 12.8 oz (83.4 kg)  01/27/18 180 lb 3.2 oz (81.7 kg)   Televisit  LAB RESULTS:  CMP     Component Value Date/Time   NA 135 10/15/2018 1518   K 4.6 10/15/2018 1518   CL 103 10/15/2018 1518   CO2 22 10/15/2018 1518   GLUCOSE 95 10/15/2018 1518   BUN 10 10/15/2018 1518   CREATININE 0.95 10/15/2018 1518   CREATININE 0.92  08/30/2017 1145   CALCIUM 9.9 10/15/2018 1518   PROT 7.5 10/29/2017 1305   ALBUMIN 4.5 10/29/2017 1305   AST 25 10/29/2017 1305   AST 28 08/30/2017 1145   ALT 15 10/29/2017 1305   ALT 19 08/30/2017 1145   ALKPHOS 55 10/29/2017 1305   BILITOT 0.6 10/29/2017 1305   BILITOT 0.6 08/30/2017 1145   GFRNONAA >60 10/15/2018 1518   GFRNONAA >60 08/30/2017 1145   GFRAA >60 10/15/2018 1518   GFRAA >60 08/30/2017 1145    No results found for: TOTALPROTELP, ALBUMINELP, A1GS, A2GS, BETS, BETA2SER, GAMS, MSPIKE, SPEI  No results found for: KPAFRELGTCHN, LAMBDASER, KAPLAMBRATIO  Lab Results  Component Value Date   WBC 12.3 (H) 10/15/2018   NEUTROABS 9.1 (H) 10/15/2018   HGB 13.7 10/15/2018   HCT 41.5 10/15/2018   MCV 91.4 10/15/2018   PLT 195 10/15/2018    @LASTCHEMISTRY @  No results found for: LABCA2  No components found for: [id number]  No results for input(s): INR in the last 168 hours.  No results found for: LABCA2  No results found for: ZHY865  No results found for: HQI696  No results found for: EXB284  No results found for: CA2729  No components found for: HGQUANT  No results found for: CEA1 / No results found for: CEA1   No results found for: AFPTUMOR  No results found for: CHROMOGRNA  No results found for: PSA1  No visits with results within 3 Day(s) from this visit.  Latest known visit with results is:  Admission on 10/15/2018, Discharged on 10/15/2018  Component  Date Value Ref Range Status  . SARS Coronavirus 2 10/15/2018 NEGATIVE  NEGATIVE Final   Comment: (NOTE) If result is NEGATIVE SARS-CoV-2 target nucleic acids are NOT DETECTED. The SARS-CoV-2 RNA is generally detectable in upper and lower  respiratory specimens during the acute phase of infection. The lowest  concentration of SARS-CoV-2 viral copies this assay can detect is 250  copies / mL. A negative result does not preclude SARS-CoV-2 infection  and should not be used as the sole basis for treatment or other  patient management decisions.  A negative result may occur with  improper specimen collection / handling, submission of specimen other  than nasopharyngeal swab, presence of viral mutation(s) within the  areas targeted by this assay, and inadequate number of viral copies  (<250 copies / mL). A negative result must be combined with clinical  observations, patient history, and epidemiological information. If result is POSITIVE SARS-CoV-2 target nucleic acids are DETECTED. The SARS-CoV-2 RNA is generally detectable in upper and lower  respiratory specimens dur                          ing the acute phase of infection.  Positive  results are indicative of active infection with SARS-CoV-2.  Clinical  correlation with patient history and other diagnostic information is  necessary to determine patient infection status.  Positive results do  not rule out bacterial infection or co-infection with other viruses. If result is PRESUMPTIVE POSTIVE SARS-CoV-2 nucleic acids MAY BE PRESENT.   A presumptive positive result was obtained on the submitted specimen  and confirmed on repeat testing.  While 2019 novel coronavirus  (SARS-CoV-2) nucleic acids may be present in the submitted sample  additional confirmatory testing may be necessary for epidemiological  and / or clinical management purposes  to differentiate between  SARS-CoV-2 and other Sarbecovirus currently known to infect humans.  If  clinically indicated additional testing with an alternate test  methodology [phone]) is advised. The SARS-CoV-2 RNA is generally  detectable in upper and lower respiratory sp                          ecimens during the acute  phase of infection. The expected result is Negative. Fact Sheet for Patients:  StrictlyIdeas.no Fact Sheet for Healthcare Providers: BankingDealers.co.za This test is not yet approved or cleared by the [place] FDA and has been authorized for detection and/or diagnosis of SARS-CoV-2 by FDA under an Emergency Use Authorization (EUA).  This EUA will remain in effect (meaning this test can be used) for the duration of the COVID-19 declaration under Section 564(b)(1) of the Act, 21 U.S.C. section 360bbb-3(b)(1), unless the authorization is terminated or revoked sooner. Performed at [facility], [place] [address] [place], [place] [address]   . WBC 10/15/2018 12.3* 4.0 - 10.5 K/uL Final  . RBC 10/15/2018 4.54  3.87 - 5.11 MIL/uL Final  . Hemoglobin 10/15/2018 13.7  12.0 - 15.0 g/dL Final  . HCT 10/15/2018 41.5  36.0 - 46.0 % Final  . MCV 10/15/2018 91.4  80.0 - 100.0 fL Final  . MCH 10/15/2018 30.2  26.0 - 34.0 pg Final  . MCHC 10/15/2018 33.0  30.0 - 36.0 g/dL Final  . RDW 10/15/2018 13.2  11.5 - 15.5 % Final  . Platelets 10/15/2018 195  150 - 400 K/uL Final  . nRBC 10/15/2018 0.0  0.0 - 0.2 % Final  . Neutrophils Relative % 10/15/2018 75  % Final  . Neutro Abs 10/15/2018 9.1* 1.7 - 7.7 K/uL Final  . Lymphocytes Relative 10/15/2018 15  % Final  . Lymphs Abs 10/15/2018 1.9  0.7 - 4.0 K/uL Final  . Monocytes Relative 10/15/2018 9  % Final  . Monocytes Absolute 10/15/2018 1.1* 0.1 - 1.0 K/uL Final  . Eosinophils Relative 10/15/2018 1  % Final  . Eosinophils Absolute 10/15/2018 0.1  0.0 - 0.5 K/uL Final  . Basophils Relative 10/15/2018 0  % Final  . Basophils Absolute 10/15/2018 0.0  0.0 - 0.1 K/uL Final  .  Immature Granulocytes 10/15/2018 0  % Final  . Abs Immature Granulocytes 10/15/2018 0.05  0.00 - 0.07 K/uL Final   Performed at [facility], [place] [address], [place], [place] [address]  . Sodium 10/15/2018 135  135 - 145 mmol/L Final  . Potassium 10/15/2018 4.6  3.5 - 5.1 mmol/L Final  . Chloride 10/15/2018 103  98 - 111 mmol/L Final  . CO2 10/15/2018 22  22 - 32 mmol/L Final  . Glucose, Bld 10/15/2018 95  70 - 99 mg/dL Final  . BUN 10/15/2018 10  8 - 23 mg/dL Final  . Creatinine, Ser 10/15/2018 0.95  0.44 - 1.00 mg/dL Final  . Calcium 10/15/2018 9.9  8.9 - 10.3 mg/dL Final  . GFR calc non Af Amer 10/15/2018 >60  >60 mL/min Final  . GFR calc Af Amer 10/15/2018 >60  >60 mL/min Final  . Anion gap 10/15/2018 10  5 - 15 Final   Performed at [facility], [place] [address], [place], [place] [address]  . Specimen Description 10/15/2018 ABSCESS RIGHT CHEST   Final  . Special Requests 10/15/2018 PATIENT ON FOLLOWING BACTRIM   Final  . Gram Stain 10/15/2018    Final  Value:NO WBC SEEN NO ORGANISMS SEEN   . Culture 10/15/2018    Final                   Value:RARE STAPHYLOCOCCUS AUREUS NO ANAEROBES ISOLATED Performed at [facility], [place] [address] [place], [place] [address]   . Report Status 10/15/2018 10/20/2018 FINAL   Final  . Organism ID, Bacteria 10/15/2018 STAPHYLOCOCCUS AUREUS   Final    (this displays the last labs from the last 3 days)  No results found for: TOTALPROTELP, ALBUMINELP, A1GS, A2GS, BETS, BETA2SER, GAMS, MSPIKE, SPEI (this displays SPEP labs)  No results found for: KPAFRELGTCHN, LAMBDASER, KAPLAMBRATIO (kappa/lambda light chains)  No results found for: HGBA, HGBA2QUANT, HGBFQUANT, HGBSQUAN (Hemoglobinopathy evaluation)   No results found for: LDH  No results found for: IRON, TIBC, IRONPCTSAT (Iron and TIBC)  No results found for: FERRITIN  Urinalysis No results found for: COLORURINE, APPEARANCEUR, LABSPEC, PHURINE, GLUCOSEU,  HGBUR, BILIRUBINUR, KETONESUR, PROTEINUR, UROBILINOGEN, NITRITE, LEUKOCYTESUR   STUDIES: No results found.   ELIGIBLE FOR AVAILABLE RESEARCH PROTOCOL: no  ASSESSMENT: 63 y.o. [place], [place] woman  (1) status post right lumpectomy and sentinel lymph node sampling 07/30/2004 for a pT1c pN0, stage IA invasive breast cancer, grade 1, estrogen and progesterone receptor positive, HER-2 not amplified, with an MIB-1 of 7%  (a) s/p adjuvant radiation  (b) intolerant of tamoxifen  (c) on aromasin for 5 years w/o complications  (2) status post right breast upper outer quadrant biopsy 08/17/2017 for a clinical T2 N0, stage IB invasive ductal carcinoma, grade 2, estrogen and progesterone receptor positive, HER-2 not amplified, with an MIB-1 of 25%  (a) a second area of concern measuring 0.4 cm was located in the upper inner quadrant  (3) right mastectomy on 09/27/2017 (Dr. Marlou Starks) showed multifocal invasive ductal carcinoma grade II, 2 tumors, 2.4cm and 0.5cm, margins negative, 2 SLN negative.  pT2, pN0, stage IB  (a) permanent implants (smooth high-profile silicone) placed 03/17/5101  (b) the right chest silicone implant had to be removed and replaced 01/27/2018 because of a persistent seroma  (4) Oncotype DX from 08/17/17: 24 (intermediate risk), predicting a 9-year risk of recurrence outside the breast of 10% if the patient's only systemic therapy is tamoxifen for 5 years.  It also predicts no significant benefit from chemotherapy.    (5) anastrozole started neoadjuvantly March 2019  (6) genetics testing on 09/08/2017 shows no deleterious mutations.  Genes tested include: APC, ATM, AXIN2, BARD1, BMPR1A, BRCA1, BRCA2, BRIP1, CDH1, CDK4, CDKN2A (p14ARF), CDKN2A (p16INK4a), CHEK2, CTNNA1, DICER1, EPCAM (Deletion/duplication testing only), GREM1 (promoter region deletion/duplication testing only), KIT, MEN1, MLH1, MSH2, MSH3, MSH6, MUTYH, NBN, NF1, NHTL1, PALB2, PDGFRA, PMS2, POLD1, POLE, PTEN, RAD50,  RAD51C, RAD51D, SDHB, SDHC, SDHD, SMAD4, SMARCA4. STK11, TP53, TSC1, TSC2, and VHL.  The following genes were evaluated for sequence changes only: SDHA and HOXB13 c.251G>A variant only.  (7) history of von Willebrand disease  PLAN: Catherine Robbins is now 1-1/2 years out from definitive surgery for her breast cancer with no evidence of disease recurrence.  This is favorable.  She is tolerating anastrozole with some side effects including hot flashes and vaginal dryness issues.  Once the pandemic clears perhaps a referral to the pelvic rehab program here would be helpful.  She will have her next mammogram in June.  She will see me again in August of next year assuming the mammogram of course is fine.  If that time is not agreeable to her for some reason she of  course can change it.  We have her down as not showing on September 15 and April 13, 2019 but she tells me she called on both those occations to reschedule and the rescheduling somehow did not get put through.  She knows to call for any other issue that may develop before the next visit.  Perry Molla, Virgie Dad, MD  05/16/19 1:40 PM Medical Oncology and Hematology [facility] [place], [place] [address] Tel. [phone]    Fax. [phone]   I, Wilburn Mylar, am acting as scribe for Dr. Virgie Dad. Catherine Robbins.  I, Lurline Del MD, have reviewed the above documentation for accuracy and completeness, and I agree with the above.

## 2019-05-16 ENCOUNTER — Inpatient Hospital Stay: Payer: BC Managed Care – PPO | Attending: Oncology | Admitting: Oncology

## 2019-05-16 DIAGNOSIS — C50411 Malignant neoplasm of upper-outer quadrant of right female breast: Secondary | ICD-10-CM

## 2019-05-16 DIAGNOSIS — Z170 Estrogen receptor positive status [ER+]: Secondary | ICD-10-CM

## 2019-05-16 MED ORDER — ANASTROZOLE 1 MG PO TABS: 1.0000 mg | ORAL_TABLET | Freq: Every day | ORAL | 4 refills | Status: DC

## 2019-05-16 MED ORDER — GABAPENTIN 300 MG PO CAPS: 300.0000 mg | ORAL_CAPSULE | Freq: Every day | ORAL | 4 refills | Status: DC

## 2019-05-17 ENCOUNTER — Telehealth: Payer: Self-pay | Admitting: Oncology

## 2019-05-17 NOTE — Telephone Encounter (Signed)
I talk with patient regarding schedule  

## 2019-08-28 DIAGNOSIS — E785 Hyperlipidemia, unspecified: Secondary | ICD-10-CM | POA: Diagnosis not present

## 2019-11-13 DIAGNOSIS — Z01419 Encounter for gynecological examination (general) (routine) without abnormal findings: Secondary | ICD-10-CM | POA: Diagnosis not present

## 2019-11-13 DIAGNOSIS — R7303 Prediabetes: Secondary | ICD-10-CM | POA: Diagnosis not present

## 2019-11-13 DIAGNOSIS — Z87898 Personal history of other specified conditions: Secondary | ICD-10-CM | POA: Diagnosis not present

## 2019-11-13 DIAGNOSIS — Z131 Encounter for screening for diabetes mellitus: Secondary | ICD-10-CM | POA: Diagnosis not present

## 2019-12-25 DIAGNOSIS — Z1231 Encounter for screening mammogram for malignant neoplasm of breast: Secondary | ICD-10-CM | POA: Diagnosis not present

## 2020-01-01 DIAGNOSIS — I10 Essential (primary) hypertension: Secondary | ICD-10-CM | POA: Diagnosis not present

## 2020-01-01 DIAGNOSIS — E785 Hyperlipidemia, unspecified: Secondary | ICD-10-CM | POA: Diagnosis not present

## 2020-01-01 DIAGNOSIS — Z1389 Encounter for screening for other disorder: Secondary | ICD-10-CM | POA: Diagnosis not present

## 2020-01-01 DIAGNOSIS — Z0000 Encounter for general adult medical examination without abnormal findings: Secondary | ICD-10-CM | POA: Diagnosis not present

## 2020-01-01 DIAGNOSIS — E039 Hypothyroidism, unspecified: Secondary | ICD-10-CM | POA: Diagnosis not present

## 2020-01-01 DIAGNOSIS — D0591 Unspecified type of carcinoma in situ of right breast: Secondary | ICD-10-CM | POA: Diagnosis not present

## 2020-01-22 ENCOUNTER — Other Ambulatory Visit: Payer: BC Managed Care – PPO

## 2020-01-22 ENCOUNTER — Ambulatory Visit: Payer: BC Managed Care – PPO | Admitting: Oncology

## 2020-02-27 DIAGNOSIS — H524 Presbyopia: Secondary | ICD-10-CM | POA: Diagnosis not present

## 2020-04-22 DIAGNOSIS — M180 Bilateral primary osteoarthritis of first carpometacarpal joints: Secondary | ICD-10-CM | POA: Diagnosis not present

## 2020-04-29 DIAGNOSIS — Z0100 Encounter for examination of eyes and vision without abnormal findings: Secondary | ICD-10-CM | POA: Diagnosis not present

## 2020-05-09 ENCOUNTER — Other Ambulatory Visit: Payer: Self-pay

## 2020-05-09 ENCOUNTER — Inpatient Hospital Stay: Payer: Medicare HMO | Attending: Oncology

## 2020-05-09 ENCOUNTER — Inpatient Hospital Stay (HOSPITAL_BASED_OUTPATIENT_CLINIC_OR_DEPARTMENT_OTHER): Payer: Medicare HMO | Admitting: Oncology

## 2020-05-09 VITALS — BP 191/102 | HR 71 | Temp 98.6°F | Resp 20 | Ht 70.0 in | Wt 182.4 lb

## 2020-05-09 DIAGNOSIS — D680 Von Willebrand's disease: Secondary | ICD-10-CM | POA: Diagnosis not present

## 2020-05-09 DIAGNOSIS — C50411 Malignant neoplasm of upper-outer quadrant of right female breast: Secondary | ICD-10-CM

## 2020-05-09 DIAGNOSIS — G629 Polyneuropathy, unspecified: Secondary | ICD-10-CM | POA: Insufficient documentation

## 2020-05-09 DIAGNOSIS — Z79811 Long term (current) use of aromatase inhibitors: Secondary | ICD-10-CM | POA: Diagnosis not present

## 2020-05-09 DIAGNOSIS — R232 Flushing: Secondary | ICD-10-CM | POA: Diagnosis not present

## 2020-05-09 DIAGNOSIS — Z170 Estrogen receptor positive status [ER+]: Secondary | ICD-10-CM

## 2020-05-09 DIAGNOSIS — Z79899 Other long term (current) drug therapy: Secondary | ICD-10-CM | POA: Diagnosis not present

## 2020-05-09 DIAGNOSIS — Z87891 Personal history of nicotine dependence: Secondary | ICD-10-CM | POA: Diagnosis not present

## 2020-05-09 DIAGNOSIS — Z8249 Family history of ischemic heart disease and other diseases of the circulatory system: Secondary | ICD-10-CM | POA: Diagnosis not present

## 2020-05-09 LAB — CBC WITH DIFFERENTIAL/PLATELET
Abs Immature Granulocytes: 0.02 10*3/uL (ref 0.00–0.07)
Basophils Absolute: 0 10*3/uL (ref 0.0–0.1)
Basophils Relative: 0 %
Eosinophils Absolute: 0.1 10*3/uL (ref 0.0–0.5)
Eosinophils Relative: 1 %
HCT: 43.1 % (ref 36.0–46.0)
Hemoglobin: 14.2 g/dL (ref 12.0–15.0)
Immature Granulocytes: 0 %
Lymphocytes Relative: 25 %
Lymphs Abs: 2.2 10*3/uL (ref 0.7–4.0)
MCH: 30.1 pg (ref 26.0–34.0)
MCHC: 32.9 g/dL (ref 30.0–36.0)
MCV: 91.3 fL (ref 80.0–100.0)
Monocytes Absolute: 0.5 10*3/uL (ref 0.1–1.0)
Monocytes Relative: 6 %
Neutro Abs: 6.1 10*3/uL (ref 1.7–7.7)
Neutrophils Relative %: 68 %
Platelets: 196 10*3/uL (ref 150–400)
RBC: 4.72 MIL/uL (ref 3.87–5.11)
RDW: 12.9 % (ref 11.5–15.5)
WBC: 8.9 10*3/uL (ref 4.0–10.5)
nRBC: 0 % (ref 0.0–0.2)

## 2020-05-09 LAB — COMPREHENSIVE METABOLIC PANEL
ALT: 21 U/L (ref 0–44)
AST: 28 U/L (ref 15–41)
Albumin: 4.3 g/dL (ref 3.5–5.0)
Alkaline Phosphatase: 71 U/L (ref 38–126)
Anion gap: 8 (ref 5–15)
BUN: 24 mg/dL — ABNORMAL HIGH (ref 8–23)
CO2: 27 mmol/L (ref 22–32)
Calcium: 10 mg/dL (ref 8.9–10.3)
Chloride: 102 mmol/L (ref 98–111)
Creatinine, Ser: 0.87 mg/dL (ref 0.44–1.00)
GFR, Estimated: >60 mL/min (ref >60)
Glucose, Bld: 110 mg/dL — ABNORMAL HIGH (ref 70–99)
Potassium: 4.4 mmol/L (ref 3.5–5.1)
Sodium: 137 mmol/L (ref 135–145)
Total Bilirubin: 0.6 mg/dL (ref 0.3–1.2)
Total Protein: 7.7 g/dL (ref 6.5–8.1)

## 2020-05-09 NOTE — Progress Notes (Addendum)
[place]  [phone] [phone]     ID: Catherine Robbins DOB: [birth year]  MR#: [id number]  JYN#:[id number]  Patient Care Team: Carol Ada, MD as PCP - General (Family Medicine) Jovita Kussmaul, MD as Consulting Physician (General Surgery) Armella Stogner, Virgie Dad, MD as Consulting Physician (Oncology) Kyung Rudd, MD as Consulting Physician (Radiation Oncology) Milly Jakob, MD as Consulting Physician (Orthopedic Surgery) OTHER MD:   CHIEF COMPLAINT: Estrogen receptor positive breast cancer  CURRENT TREATMENT: anastrozole   INTERVAL HISTORY: Catherine Robbins returns today for follow-up of her estrogen receptor positive breast cancer.  She continues on anastrozole.  Hot flashes are not a major problem but she really does have issues with vaginal dryness.  She has not found anything that works for this  Her last bone density screening was on 09/12/2015 at [place] showing a T-score of -2.1, which is considered osteopenic.  Mammography at [facility] 12/25/2019 found the breast density to be category A.  There was no evidence of disease recurrence.  REVIEW OF SYSTEMS: [employer], walks and runs, and otherwise exercises 3-4 times a week.  Detailed review of systems today was entirely benign.   COVID 19 VACCINATION STATUS: Refuses vaccines   HISTORY OF CURRENT ILLNESS: From the original intake note:  Catherine Robbins has a history of prior right-sided breast cancer, status post lumpectomy on 07/30/2004 for a 1.1 cm invasive ductal carcinoma, grade 1, with negative margins and a single sentinel lymph node clear.  The tumor was estrogen receptor 93% positive, progesterone receptor 83% positive, with an MIB-1 of 7% and no HER-2 amplification ([id number]).  She received adjuvant radiation radiation. She took tamoxifen for only a month due to poor tolerance, including severe hot flashes. She was switched to exemestane and tolerated this well, taking  it for a total of 5 years  More recently she had routine screening mammography on 08/11/2017 showing a possible abnormality in the right breast. She underwent unilateral right diagnostic mammography with tomography and right breast ultrasonography at [facility] on 08/13/2017 showing: breast density category A. By ultrasound, the new 2.6 cm irregular mass in the 11 o'clock right upper outer quadrant  posterior depth located 4 cm from the nipple is likely carcinoma. Additionally, there is a 0.4 cm irregular mass in the right upper outer quadrant anterior depth and 3 cm from the nipple. These masses correlated to mammography findings. Evaluation of the right axilla was unremarkable.  Accordingly on 08/17/2017 she proceeded to biopsy of the right breast mass in question. The pathology from this procedure showed ([id number]): At the 11 o'clock 4 cmfn, invasive ductal carcinoma, grade II. Prognostic indicators significant for: estrogen receptor, 90% positive and progesterone receptor, 100% positive, both with strong staining intensity. Proliferation marker Ki67 at 25%. HER2 not amplified with ratios HER2/CEP17 signals 1.28 and average HER2 copies per cell 2.09.   The patient's subsequent history is as detailed below.   PAST MEDICAL HISTORY: Past Medical History:  Diagnosis Date  . Breast cancer in female [facility])    Right  . Cancer ([place])   . 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 Hypertension   . Hypothyroidism   . Low sodium levels   . Lyme disease   . Von Willebrand disease ([place])    HTN, HLD, Von Willebrand's Disease, Lyme Disease and Bartonella (chronic since 1998) neuropathy in the feet secondary to the Lyme disease 2004. She is a DES Daughter.    PAST SURGICAL HISTORY: Past Surgical History:  Procedure  Laterality Date  . BREAST IMPLANT REMOVAL Right 01/27/2018   Procedure: REMOVE AND REPLACE  BREAST IMPLANT - WITH DRAIN PLACEMENT;  Surgeon: Irene Limbo, MD;  Location: [place];  Service:  Plastics;  Laterality: Right;  . BREAST IMPLANT REMOVAL Right 10/15/2018   Procedure: REMOVAL BREAST IMPLANTS;  Surgeon: Irene Limbo, MD;  Location: [place];  Service: Plastics;  Laterality: Right;  . BREAST LUMPECTOMY Right   . BREAST RECONSTRUCTION WITH PLACEMENT OF TISSUE EXPANDER AND FLEX HD (ACELLULAR HYDRATED DERMIS) Right 09/27/2017   Procedure: RIGHT BREAST RECONSTRUCTION WITH PLACEMENT OF TISSUE EXPANDER AND FLEX HD (ACELLULAR HYDRATED DERMIS);  Surgeon: Wallace Going, DO;  Location: [place];  Service: Plastics;  Laterality: Right;  . BREAST REDUCTION SURGERY Left 01/07/2018   Procedure: breast reduction/mastopexy for symmetry left;  Surgeon: Wallace Going, DO;  Location: [place];  Service: Plastics;  Laterality: Left;  Marland Kitchen MASTECTOMY W/ SENTINEL NODE BIOPSY Right 09/27/2017   Procedure: RIGHT MASTECTOMY WITH SENTINEL LYMPH NODE BIOPSY;  Surgeon: Jovita Kussmaul, MD;  Location: [place];  Service: General;  Laterality: Right;  . REMOVAL OF TISSUE EXPANDER AND PLACEMENT OF IMPLANT Right 01/07/2018   Procedure: REMOVAL OF TISSUE EXPANDER AND PLACEMENT OF IMPLANT;  Surgeon: Wallace Going, DO;  Location: [place];  Service: Plastics;  Laterality: Right;  . TONSILLECTOMY    Wisdom teeth extraction in the 53's    FAMILY HISTORY Family History  Problem Relation Age of Onset  . DES usage Mother   . Other Brother        DES exposure  . Heart disease Maternal Grandfather        d. 93  . Other Sister        DES exposure   The patient's father is alive at age 68. The patient's mother died at age 44 from heart disease. The patient has 1 brother and 2 sisters. She denies a history of breast, ovarian, or any other cancers in the family.    GYNECOLOGIC HISTORY:  No LMP recorded. Patient is postmenopausal. Menarche: 40-39 years old Age at first live birth: 64 years old She is GXP2. Her LMP was in 2006. She used oral contraceptives for over 5  years with no complications. She also used HRT around 2005 for about a year until her initial breast cancer diagnosis in 2006.      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 She is also in the process of retiring. Her husband, [name], is retired from Investment banker, corporate. The patient's daughter, Mickel Baas, is a Cabin crew in [place], [place]. The patient's second daughter, Claiborne Billings, works in office and child care in [place]. She has no grandchildren. She is a person of faith, but is private.    ADVANCED DIRECTIVES: The absence of any documents to the contrary the patient's husband is her healthcare power of attorney   HEALTH MAINTENANCE: Social History   Tobacco Use  . Smoking status: Former Research scientist (life sciences)  . Smokeless tobacco: Never Used  Vaping Use  . Vaping Use: Never used  Substance Use Topics  . Alcohol use: Yes    Comment: Occasional  . Drug use: Never     Colonoscopy: 2016/ in [place]  PAP: March 2017/ normal  Bone density: 2018 at Dr. Smith/ osteopenia    Allergies  Allergen Reactions  . Bupropion Other (See Comments)    DIZZINESS UNABLE TO STAND  .  Chlorhexidine Gluconate  [Chlorhexidine] Rash    Developed rash over the chest, neck and underarms where was prepped with Chlorhexidine in the OR  . Tamoxifen Other (See Comments)    EXTREME FATIGUE HOT FLASHES SWEATING    Current Outpatient Medications  Medication Sig Dispense Refill  . anastrozole (ARIMIDEX) 1 MG tablet Take 1 tablet (1 mg total) by mouth daily. 90 tablet 4  . Ascorbic Acid (VITAMIN C PO) Take by mouth.    Marland Kitchen CALCIUM PO Take by mouth.    . Cyanocobalamin (VITAMIN B-12 PO) Take by mouth.    . gabapentin (NEURONTIN) 300 MG capsule Take 1 capsule (300 mg total) by mouth at bedtime. 90 capsule 4  . ibuprofen (IBU-200) 200 MG tablet Take 800 mg by mouth as needed (Reports taking sporadic  and taking between 200- 800 mg at a time).    . Misc Natural Products (GLUCOSAMINE CHOND COMPLEX/MSM PO) Take by mouth.    . Multiple Vitamins-Minerals (MULTIVITAMIN ADULT PO) Take by mouth.    . NP THYROID 60 MG tablet Take 60 mg by mouth daily before breakfast.     . propranolol (INDERAL) 20 MG tablet Take 20 mg by mouth daily.     Marland Kitchen VITAMIN D PO Take by mouth.    . Zinc Acetate, Oral, (ZINC ACETATE PO) Take by mouth.     No current facility-administered medications for this visit.    OBJECTIVE: [place] woman who appears younger than stated age  95:   05/09/20 1413  BP: (!) 191/102  Pulse: 71  Resp: 20  Temp: 98.6 F (37 C)  SpO2: 100%     Body mass index is 26.17 kg/m.   Wt Readings from Last 3 Encounters:  05/09/20 182 lb 6.4 oz (82.7 kg)  10/15/18 180 lb (81.6 kg)  05/11/18 183 lb 12.8 oz (83.4 kg)   Sclerae unicteric, EOMs intact Wearing a mask No cervical or supraclavicular adenopathy Lungs no rales or rhonchi Heart regular rate and rhythm Abd soft, nontender, positive bowel sounds MSK no focal spinal tenderness, no upper extremity lymphedema Neuro: nonfocal, well oriented, appropriate affect Breasts: The right breast is status post mastectomy.  There is no evidence of chest wall recurrence.  The left breast is benign.  Both axillae are benign   LAB RESULTS:  CMP     Component Value Date/Time   NA 137 05/09/2020 1353   K 4.4 05/09/2020 1353   CL 102 05/09/2020 1353   CO2 27 05/09/2020 1353   GLUCOSE 110 (H) 05/09/2020 1353   BUN 24 (H) 05/09/2020 1353   CREATININE 0.87 05/09/2020 1353   CREATININE 0.92 08/30/2017 1145   CALCIUM 10.0 05/09/2020 1353   PROT 7.7 05/09/2020 1353   ALBUMIN 4.3 05/09/2020 1353   AST 28 05/09/2020 1353   AST 28 08/30/2017 1145   ALT 21 05/09/2020 1353   ALT 19 08/30/2017 1145   ALKPHOS 71 05/09/2020 1353   BILITOT 0.6 05/09/2020 1353   BILITOT 0.6 08/30/2017 1145   GFRNONAA >60 05/09/2020 1353   GFRNONAA >60 08/30/2017  1145   GFRAA >60 10/15/2018 1518   GFRAA >60 08/30/2017 1145    Lab Results  Component Value Date   WBC 8.9 05/09/2020   NEUTROABS 6.1 05/09/2020   HGB 14.2 05/09/2020   HCT 43.1 05/09/2020   MCV 91.3 05/09/2020   PLT 196 05/09/2020    No results found for: LABCA2  No components found for: DJMEQA834  No results for input(s): INR in the  last 168 hours.  No results found for: LABCA2  No results found for: MWN027  No results found for: OZD664  No results found for: QIH474  No results found for: CA2729  No components found for: HGQUANT  No results found for: CEA1 / No results found for: CEA1   No results found for: AFPTUMOR  No results found for: CHROMOGRNA  No results found for: TOTALPROTELP, ALBUMINELP, A1GS, A2GS, BETS, BETA2SER, GAMS, MSPIKE, SPEI (this displays SPEP labs)  No results found for: KPAFRELGTCHN, LAMBDASER, KAPLAMBRATIO (kappa/lambda light chains)  No results found for: HGBA, HGBA2QUANT, HGBFQUANT, HGBSQUAN (Hemoglobinopathy evaluation)   No results found for: LDH  No results found for: IRON, TIBC, IRONPCTSAT (Iron and TIBC)  No results found for: FERRITIN  Urinalysis No results found for: COLORURINE, APPEARANCEUR, LABSPEC, PHURINE, GLUCOSEU, HGBUR, BILIRUBINUR, KETONESUR, PROTEINUR, UROBILINOGEN, NITRITE, LEUKOCYTESUR   STUDIES: No results found.   ELIGIBLE FOR AVAILABLE RESEARCH PROTOCOL: no  ASSESSMENT: 64 y.o. [place], [place] woman  (1) status post right lumpectomy and sentinel lymph node sampling 07/30/2004 for a pT1c pN0, stage IA invasive breast cancer, grade 1, estrogen and progesterone receptor positive, HER-2 not amplified, with an MIB-1 of 7%  (a) s/p adjuvant radiation  (b) intolerant of tamoxifen  (c) on aromasin for 5 years w/o complications  (2) status post right breast upper outer quadrant biopsy 08/17/2017 for a clinical T2 N0, stage IB invasive ductal carcinoma, grade 2, estrogen and progesterone receptor positive,  HER-2 not amplified, with an MIB-1 of 25%  (a) a second area of concern measuring 0.4 cm was located in the upper inner quadrant  (3) right mastectomy on 09/27/2017 (Dr. Marlou Starks) showed multifocal invasive ductal carcinoma grade II, 2 tumors, 2.4cm and 0.5cm, margins negative, 2 SLN negative.  pT2, pN0, stage IB  (a) permanent implants (smooth high-profile silicone) placed 25/95/6387  (b) the right chest silicone implant had to be removed and replaced 01/27/2018 because of a persistent seroma  (4) Oncotype DX from 08/17/17: 24 (intermediate risk), predicting a 9-year risk of recurrence outside the breast of 10% if the patient's only systemic therapy is tamoxifen for 5 years.  It also predicts no significant benefit from chemotherapy.    (5) anastrozole started neoadjuvantly March 2019  (6) genetics testing on 09/08/2017 shows no deleterious mutations.  Genes tested include: APC, ATM, AXIN2, BARD1, BMPR1A, BRCA1, BRCA2, BRIP1, CDH1, CDK4, CDKN2A (p14ARF), CDKN2A (p16INK4a), CHEK2, CTNNA1, DICER1, EPCAM (Deletion/duplication testing only), GREM1 (promoter region deletion/duplication testing only), KIT, MEN1, MLH1, MSH2, MSH3, MSH6, MUTYH, NBN, NF1, NHTL1, PALB2, PDGFRA, PMS2, POLD1, POLE, PTEN, RAD50, RAD51C, RAD51D, SDHB, SDHC, SDHD, SMAD4, SMARCA4. STK11, TP53, TSC1, TSC2, and VHL.  The following genes were evaluated for sequence changes only: SDHA and HOXB13 c.251G>A variant only.  (7) history of von Willebrand disease   PLAN: Catherine Robbins is now 2-1/2 years out from definitive surgery for her breast cancer with no evidence of disease recurrence.  This is very favorable.  She is tolerating anastrozole well except for vaginal dryness issues.  She is interested in participating in our pelvic rehab program and I have placed the appropriate referral.  We consider switching to tamoxifen which would allow her to safely use vaginal estrogens, but she tried tamoxifen in the remote past and it was unbearable for  her.  We discussed the Josph Macho touch which she is not interested in at present  I wrote her for bras and prosthesis in case her insurance covers that.  Her blood pressure was high today but  she assures me she has "helped [place] coat syndrome" and when she checks her blood pressure at home it is in the normal range  She will see me again in 1 year.  The plan is to continue through 5 years on anastrozole which will take [place] to February 2024   Total encounter time 25 minutes.*  Ludia Gartland, Virgie Dad, MD  05/09/20 2:42 PM Medical Oncology and Hematology [facility] [place], [place] [address] Tel. [phone]    Fax. [phone]   I, Wilburn Mylar, am acting as scribe for Dr. Virgie Dad. Erendida Wrenn.  I, Lurline Del MD, have reviewed the above documentation for accuracy and completeness, and I agree with the above.   *Total Encounter Time as defined by the Centers for Medicare and Medicaid Services includes, in addition to the face-to-face time of a patient visit (documented in the note above) non-face-to-face time: obtaining and reviewing outside history, ordering and reviewing medications, tests or procedures, care coordination (communications with other health care professionals or caregivers) and documentation in the medical record.

## 2020-07-10 DIAGNOSIS — E785 Hyperlipidemia, unspecified: Secondary | ICD-10-CM | POA: Diagnosis not present

## 2020-07-10 DIAGNOSIS — E039 Hypothyroidism, unspecified: Secondary | ICD-10-CM | POA: Diagnosis not present

## 2020-07-10 DIAGNOSIS — I10 Essential (primary) hypertension: Secondary | ICD-10-CM | POA: Diagnosis not present

## 2020-07-29 ENCOUNTER — Other Ambulatory Visit: Payer: Self-pay | Admitting: Oncology

## 2020-08-22 DIAGNOSIS — E785 Hyperlipidemia, unspecified: Secondary | ICD-10-CM | POA: Diagnosis not present

## 2020-09-17 DIAGNOSIS — L03119 Cellulitis of unspecified part of limb: Secondary | ICD-10-CM | POA: Diagnosis not present

## 2020-09-23 DIAGNOSIS — S61431D Puncture wound without foreign body of right hand, subsequent encounter: Secondary | ICD-10-CM | POA: Diagnosis not present

## 2020-10-08 DIAGNOSIS — Z0100 Encounter for examination of eyes and vision without abnormal findings: Secondary | ICD-10-CM | POA: Diagnosis not present

## 2020-10-31 ENCOUNTER — Other Ambulatory Visit: Payer: Self-pay | Admitting: Oncology

## 2020-12-30 DIAGNOSIS — Z1231 Encounter for screening mammogram for malignant neoplasm of breast: Secondary | ICD-10-CM | POA: Diagnosis not present

## 2021-01-07 DIAGNOSIS — R928 Other abnormal and inconclusive findings on diagnostic imaging of breast: Secondary | ICD-10-CM | POA: Diagnosis not present

## 2021-01-07 DIAGNOSIS — R922 Inconclusive mammogram: Secondary | ICD-10-CM | POA: Diagnosis not present

## 2021-01-16 DIAGNOSIS — Z0000 Encounter for general adult medical examination without abnormal findings: Secondary | ICD-10-CM | POA: Diagnosis not present

## 2021-01-16 DIAGNOSIS — E785 Hyperlipidemia, unspecified: Secondary | ICD-10-CM | POA: Diagnosis not present

## 2021-01-16 DIAGNOSIS — Z1389 Encounter for screening for other disorder: Secondary | ICD-10-CM | POA: Diagnosis not present

## 2021-01-16 DIAGNOSIS — E039 Hypothyroidism, unspecified: Secondary | ICD-10-CM | POA: Diagnosis not present

## 2021-01-16 DIAGNOSIS — I10 Essential (primary) hypertension: Secondary | ICD-10-CM | POA: Diagnosis not present

## 2021-01-29 ENCOUNTER — Other Ambulatory Visit: Payer: Self-pay | Admitting: Oncology

## 2021-04-29 DIAGNOSIS — H5213 Myopia, bilateral: Secondary | ICD-10-CM | POA: Diagnosis not present

## 2021-05-02 ENCOUNTER — Other Ambulatory Visit: Payer: Self-pay | Admitting: Oncology

## 2021-05-02 NOTE — Telephone Encounter (Signed)
Per last office note, Dr. Jana Hakim states to continue to refill anastrozole until 07/2022. Gardiner Rhyme, RN

## 2021-05-07 ENCOUNTER — Other Ambulatory Visit: Payer: Self-pay

## 2021-05-07 DIAGNOSIS — C50411 Malignant neoplasm of upper-outer quadrant of right female breast: Secondary | ICD-10-CM

## 2021-05-07 DIAGNOSIS — Z170 Estrogen receptor positive status [ER+]: Secondary | ICD-10-CM

## 2021-05-07 NOTE — Progress Notes (Signed)
[place]  [phone] [phone]) [phone]     ID: Catherine Robbins DOB: 1955/07/29  MR#: [id number]  OVF#:[id number]  Patient Care Team: Catherine Ada, MD as PCP - General (Family Medicine) Catherine Kussmaul, MD as Consulting Physician (General Surgery) Catherine Robbins, Catherine Dad, MD as Consulting Physician (Oncology) Catherine Rudd, MD as Consulting Physician (Radiation Oncology) Catherine Jakob, MD as Consulting Physician (Orthopedic Surgery) OTHER MD:   CHIEF COMPLAINT: Estrogen receptor positive breast cancer  CURRENT TREATMENT: anastrozole   INTERVAL HISTORY: Catherine Robbins returns today for follow-up of her estrogen receptor positive breast cancer.  She continues on anastrozole.  She is tolerating this better.  Hot flashes are less of an issue.  She still has vaginal atrophy symptoms but they are improved with the regular use of emollients.  She was not interested in participating in our pelvic rehab program  Her last bone density screening was on 09/12/2015 at [facility] showing a T-score of -2.1, which is considered osteopenic.   REVIEW OF SYSTEMS: [facility]  She does a lot of gardening and generally keeps her self busy.  Her 2 year old father is in long-term care in [place] and she does go there on a regular basis aside from that a detailed review of systems today was stable.   COVID 19 VACCINATION STATUS: Refuses vaccines; had COVID June 2022   HISTORY OF CURRENT ILLNESS: From the original intake note:  Catherine Robbins has a history of prior right-sided breast cancer, status post lumpectomy on 07/30/2004 for a 1.1 cm invasive ductal carcinoma, grade 1, with negative margins and a single sentinel lymph node clear.  The tumor was estrogen receptor 93% positive, progesterone receptor 83% positive, with an MIB-1 of 7% and no HER-2 amplification ([id number]).  She received adjuvant radiation radiation. She took tamoxifen for only a  month due to poor tolerance, including severe hot flashes. She was switched to exemestane and tolerated this well, taking it for a total of 5 years  More recently she had routine screening mammography on 08/11/2017 showing a possible abnormality in the right breast. She underwent unilateral right diagnostic mammography with tomography and right breast ultrasonography at [facility] on 08/13/2017 showing: breast density category A. By ultrasound, the new 2.6 cm irregular mass in the 11 o'clock right upper outer quadrant  posterior depth located 4 cm from the nipple is likely carcinoma. Additionally, there is a 0.4 cm irregular mass in the right upper outer quadrant anterior depth and 3 cm from the nipple. These masses correlated to mammography findings. Evaluation of the right axilla was unremarkable.  Accordingly on 08/17/2017 she proceeded to biopsy of the right breast mass in question. The pathology from this procedure showed ([id number]): At the 11 o'clock 4 cmfn, invasive ductal carcinoma, grade II. Prognostic indicators significant for: estrogen receptor, 90% positive and progesterone receptor, 100% positive, both with strong staining intensity. Proliferation marker Ki67 at 25%. HER2 not amplified with ratios HER2/CEP17 signals 1.28 and average HER2 copies per cell 2.09.   The patient's subsequent history is as detailed below.   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 She is a DES Daughter.    PAST SURGICAL HISTORY: Past Surgical History:  Procedure Laterality Date   BREAST IMPLANT REMOVAL Right 01/27/2018   Procedure: REMOVE AND REPLACE  BREAST  IMPLANT - WITH DRAIN PLACEMENT;  Surgeon: Catherine Limbo, MD;  Location: [place];  Service: Plastics;  Laterality: Right;   BREAST IMPLANT REMOVAL Right 10/15/2018   Procedure: REMOVAL BREAST IMPLANTS;  Surgeon: Catherine Limbo, MD;  Location: [place];  Service: Plastics;  Laterality: Right;   BREAST LUMPECTOMY Right    BREAST RECONSTRUCTION WITH PLACEMENT OF TISSUE EXPANDER AND FLEX HD (ACELLULAR HYDRATED DERMIS) Right 09/27/2017   Procedure: RIGHT BREAST RECONSTRUCTION WITH PLACEMENT OF TISSUE EXPANDER AND FLEX HD (ACELLULAR HYDRATED DERMIS);  Surgeon: Catherine Going, DO;  Location: [place];  Service: Plastics;  Laterality: Right;   BREAST REDUCTION SURGERY Left 01/07/2018   Procedure: breast reduction/mastopexy for symmetry left;  Surgeon: Catherine Going, DO;  Location: [place];  Service: Plastics;  Laterality: Left;   MASTECTOMY W/ SENTINEL NODE BIOPSY Right 09/27/2017   Procedure: RIGHT MASTECTOMY WITH SENTINEL LYMPH NODE BIOPSY;  Surgeon: Catherine Kussmaul, MD;  Location: [place];  Service: General;  Laterality: Right;   REMOVAL OF TISSUE EXPANDER AND PLACEMENT OF IMPLANT Right 01/07/2018   Procedure: REMOVAL OF TISSUE EXPANDER AND PLACEMENT OF IMPLANT;  Surgeon: Catherine Going, DO;  Location: [place];  Service: Plastics;  Laterality: Right;   TONSILLECTOMY    Wisdom teeth extraction in the 27's    FAMILY HISTORY Family History  Problem Relation Age of Onset   DES usage Mother    Other Brother        DES exposure   Heart disease Maternal Grandfather        d. 67   Other Sister        DES exposure   The patient's father is alive at age 62. The patient's mother died at age 82 from heart disease. The patient has 1 brother and 2 sisters. She denies a history of breast, ovarian, or any other cancers in the family.    GYNECOLOGIC HISTORY:  No LMP recorded. Patient is postmenopausal. Menarche: 74-23 years old Age at first  live birth: 65 years old She is GXP2. Her LMP was in 2006. She used oral contraceptives for over 5 years with no complications. She also used HRT around 2005 for about a year until her initial breast cancer diagnosis in 2006.      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 She is also in the process of retiring. Her husband, [name], is retired from Investment banker, corporate. The patient's daughter, Mickel Baas, is a Cabin crew in [place], [place]. The patient's second daughter, Claiborne Billings, works in office and child care in [place]. She has no grandchildren. She is a person of faith, but is private.    ADVANCED DIRECTIVES: The absence of any documents to the contrary the patient's husband is her healthcare power of attorney   HEALTH MAINTENANCE: Social History   Tobacco Use   Smoking status: Former   Smokeless tobacco: Never  Vaping Use   Vaping Use: Never used  Substance Use Topics   Alcohol use: Yes    Comment: Occasional   Drug use: Never     Colonoscopy: 2016/ in [place]  PAP: March 2017/ normal  Bone  density: 2018 at Dr. Smith/ osteopenia    Allergies  Allergen Reactions   Bupropion Other (See Comments)    DIZZINESS UNABLE TO STAND   Chlorhexidine Gluconate  [Chlorhexidine] Rash    Developed rash over the chest, neck and underarms where was prepped with Chlorhexidine in the OR   Tamoxifen Other (See Comments)    EXTREME FATIGUE HOT FLASHES SWEATING    Current Outpatient Medications  Medication Sig Dispense Refill   anastrozole (ARIMIDEX) 1 MG tablet Take 1 tablet by mouth once daily 90 tablet 0   Ascorbic Acid (VITAMIN C PO) Take by mouth.     CALCIUM PO Take by mouth.     Cyanocobalamin (VITAMIN B-12 PO) Take by mouth.     gabapentin (NEURONTIN) 300 MG capsule Take 1 capsule (300 mg total) by mouth at bedtime. 90 capsule 4   ibuprofen (IBU-200) 200 MG  tablet Take 800 mg by mouth as needed (Reports taking sporadic and taking between 200- 800 mg at a time).     Misc Natural Products (GLUCOSAMINE CHOND COMPLEX/MSM PO) Take by mouth.     Multiple Vitamins-Minerals (MULTIVITAMIN ADULT PO) Take by mouth.     NP THYROID 60 MG tablet Take 60 mg by mouth daily before breakfast.      propranolol (INDERAL) 20 MG tablet Take 20 mg by mouth daily.      VITAMIN D PO Take by mouth.     Zinc Acetate, Oral, (ZINC ACETATE PO) Take by mouth.     No current facility-administered medications for this visit.    OBJECTIVE: [place] woman who appears younger than stated age  65:   05/08/21 1410  BP: (!) 174/99  Pulse: 82  Resp: 18  Temp: (!) 97.3 F (36.3 C)  SpO2: 100%      Body mass index is 27.15 kg/m.   Wt Readings from Last 3 Encounters:  05/08/21 189 lb 3.2 oz (85.8 kg)  05/09/20 182 lb 6.4 oz (82.7 kg)  10/15/18 180 lb (81.6 kg)   Sclerae unicteric, EOMs intact Wearing a mask No cervical or supraclavicular adenopathy Lungs no rales or rhonchi Heart regular rate and rhythm Abd soft, nontender, positive bowel sounds MSK no focal spinal tenderness, no upper extremity lymphedema Neuro: nonfocal, well oriented, appropriate affect Breasts: She is status post right mastectomy with no evidence of chest wall recurrence.  Left breast and both axillae are benign.   LAB RESULTS:  CMP     Component Value Date/Time   NA 137 05/09/2020 1353   K 4.4 05/09/2020 1353   CL 102 05/09/2020 1353   CO2 27 05/09/2020 1353   GLUCOSE 110 (H) 05/09/2020 1353   BUN 24 (H) 05/09/2020 1353   CREATININE 0.87 05/09/2020 1353   CREATININE 0.92 08/30/2017 1145   CALCIUM 10.0 05/09/2020 1353   PROT 7.7 05/09/2020 1353   ALBUMIN 4.3 05/09/2020 1353   AST 28 05/09/2020 1353   AST 28 08/30/2017 1145   ALT 21 05/09/2020 1353   ALT 19 08/30/2017 1145   ALKPHOS 71 05/09/2020 1353   BILITOT 0.6 05/09/2020 1353   BILITOT 0.6 08/30/2017 1145   GFRNONAA >60  05/09/2020 1353   GFRNONAA >60 08/30/2017 1145   GFRAA >60 10/15/2018 1518   GFRAA >60 08/30/2017 1145    Lab Results  Component Value Date   WBC 6.8 05/08/2021   NEUTROABS 4.2 05/08/2021   HGB 13.9 05/08/2021   HCT 42.8 05/08/2021   MCV 91.1 05/08/2021   PLT 207 05/08/2021  No results found for: LABCA2  No components found for: [id number]  No results for input(s): INR in the last 168 hours.  No results found for: LABCA2  No results found for: SFK812  No results found for: XNT700  No results found for: FVC944  No results found for: CA2729  No components found for: HGQUANT  No results found for: CEA1 / No results found for: CEA1   No results found for: AFPTUMOR  No results found for: CHROMOGRNA  No results found for: TOTALPROTELP, ALBUMINELP, A1GS, A2GS, BETS, BETA2SER, GAMS, MSPIKE, SPEI (this displays SPEP labs)  No results found for: KPAFRELGTCHN, LAMBDASER, KAPLAMBRATIO (kappa/lambda light chains)  No results found for: HGBA, HGBA2QUANT, HGBFQUANT, HGBSQUAN (Hemoglobinopathy evaluation)   No results found for: LDH  No results found for: IRON, TIBC, IRONPCTSAT (Iron and TIBC)  No results found for: FERRITIN  Urinalysis No results found for: COLORURINE, APPEARANCEUR, LABSPEC, PHURINE, GLUCOSEU, HGBUR, BILIRUBINUR, KETONESUR, PROTEINUR, UROBILINOGEN, NITRITE, LEUKOCYTESUR   STUDIES: No results found.   ELIGIBLE FOR AVAILABLE RESEARCH PROTOCOL: no  ASSESSMENT: 65 y.o. , [place] woman  (1) status post right lumpectomy and sentinel lymph node sampling 07/30/2004 for a pT1c pN0, stage IA invasive breast cancer, grade 1, estrogen and progesterone receptor positive, HER-2 not amplified, with an MIB-1 of 7%  (a) s/p adjuvant radiation  (b) intolerant of tamoxifen  (c) on aromasin for 5 years w/o complications  (2) status post right breast upper outer quadrant biopsy 08/17/2017 for a clinical T2 N0, stage IB invasive ductal carcinoma, grade 2,  estrogen and progesterone receptor positive, HER-2 not amplified, with an MIB-1 of 25%  (a) a second area of concern measuring 0.4 cm was located in the upper inner quadrant  (3) right mastectomy on 09/27/2017 (Dr. Marlou Starks) showed multifocal invasive ductal carcinoma grade II, 2 tumors, 2.4cm and 0.5cm, margins negative, 2 SLN negative.  pT2, pN0, stage IB  (a) permanent implants (smooth high-profile silicone) placed 96/75/9163  (b) the right chest silicone implant had to be removed and replaced 01/27/2018 because of a persistent seroma  (4) Oncotype DX from 08/17/17: 24, predicting a 9-year risk of recurrence outside the breast of 10% if the patient's only systemic therapy is tamoxifen for 5 years.  It also predicts no significant benefit from chemotherapy.    (5) anastrozole started neoadjuvantly March 2019  (6) genetics testing on 09/08/2017 shows no deleterious mutations.  Genes tested include: APC, ATM, AXIN2, BARD1, BMPR1A, BRCA1, BRCA2, BRIP1, CDH1, CDK4, CDKN2A (p14ARF), CDKN2A (p16INK4a), CHEK2, CTNNA1, DICER1, EPCAM (Deletion/duplication testing only), GREM1 (promoter region deletion/duplication testing only), KIT, MEN1, MLH1, MSH2, MSH3, MSH6, MUTYH, NBN, NF1, NHTL1, PALB2, PDGFRA, PMS2, POLD1, POLE, PTEN, RAD50, RAD51C, RAD51D, SDHB, SDHC, SDHD, SMAD4, SMARCA4. STK11, TP53, TSC1, TSC2, and VHL.  The following genes were evaluated for sequence changes only: SDHA and HOXB13 c.251G>A variant only.  (7) history of von Willebrand disease  (8) history of DES intrauterine exposure   PLAN: Catherine Robbins is now 3 and half years out from definitive surgery for her breast cancer with no evidence of disease recurrence.  This is very favorable.  She is tolerating anastrozole moderately well.  The plan is to continue that for total of 5 years.  She is a little bit behind on her cervical cancer screening.  Recall she has a history of intrauterine DES exposure.  She will be calling her gynecologist with in  moderate  I commended her excellent exercise program.  I gave her a prescription for bras and prosthesis.  Otherwise she will see [place] again in 1 year.  That would likely be her "graduation" visit.    She knows to call for any other issue that may develop before the next visit  Total encounter time 25 minutes.*  Tessla Spurling, Catherine Dad, MD  05/08/21 2:41 PM Medical Oncology and Hematology [facility] [place], [place] [address] Tel. [phone]    Fax. [phone]   I, Wilburn Mylar, am acting as scribe for Dr. Virgie Robbins. Trinten Boudoin.  I, Lurline Del MD, have reviewed the above documentation for accuracy and completeness, and I agree with the above.   *Total Encounter Time as defined by the Centers for Medicare and Medicaid Services includes, in addition to the face-to-face time of a patient visit (documented in the note above) non-face-to-face time: obtaining and reviewing outside history, ordering and reviewing medications, tests or procedures, care coordination (communications with other health care professionals or caregivers) and documentation in the medical record.

## 2021-05-08 ENCOUNTER — Other Ambulatory Visit: Payer: Self-pay

## 2021-05-08 ENCOUNTER — Inpatient Hospital Stay: Payer: Medicare HMO | Attending: Oncology | Admitting: Oncology

## 2021-05-08 ENCOUNTER — Inpatient Hospital Stay: Payer: Medicare HMO

## 2021-05-08 VITALS — BP 174/99 | HR 82 | Temp 97.3°F | Resp 18 | Ht 70.0 in | Wt 189.2 lb

## 2021-05-08 DIAGNOSIS — Z9189 Other specified personal risk factors, not elsewhere classified: Secondary | ICD-10-CM

## 2021-05-08 DIAGNOSIS — E785 Hyperlipidemia, unspecified: Secondary | ICD-10-CM | POA: Diagnosis not present

## 2021-05-08 DIAGNOSIS — C50411 Malignant neoplasm of upper-outer quadrant of right female breast: Secondary | ICD-10-CM | POA: Diagnosis not present

## 2021-05-08 DIAGNOSIS — Z8249 Family history of ischemic heart disease and other diseases of the circulatory system: Secondary | ICD-10-CM | POA: Insufficient documentation

## 2021-05-08 DIAGNOSIS — Z9011 Acquired absence of right breast and nipple: Secondary | ICD-10-CM | POA: Diagnosis not present

## 2021-05-08 DIAGNOSIS — R232 Flushing: Secondary | ICD-10-CM | POA: Diagnosis not present

## 2021-05-08 DIAGNOSIS — Z170 Estrogen receptor positive status [ER+]: Secondary | ICD-10-CM

## 2021-05-08 DIAGNOSIS — Z79811 Long term (current) use of aromatase inhibitors: Secondary | ICD-10-CM | POA: Diagnosis not present

## 2021-05-08 DIAGNOSIS — I10 Essential (primary) hypertension: Secondary | ICD-10-CM | POA: Diagnosis not present

## 2021-05-08 DIAGNOSIS — E039 Hypothyroidism, unspecified: Secondary | ICD-10-CM | POA: Insufficient documentation

## 2021-05-08 DIAGNOSIS — Z79899 Other long term (current) drug therapy: Secondary | ICD-10-CM | POA: Insufficient documentation

## 2021-05-08 LAB — CMP (CANCER CENTER ONLY)
ALT: 23 U/L (ref 0–44)
AST: 31 U/L (ref 15–41)
Albumin: 4.5 g/dL (ref 3.5–5.0)
Alkaline Phosphatase: 71 U/L (ref 38–126)
Anion gap: 13 (ref 5–15)
BUN: 15 mg/dL (ref 8–23)
CO2: 23 mmol/L (ref 22–32)
Calcium: 9.5 mg/dL (ref 8.9–10.3)
Chloride: 103 mmol/L (ref 98–111)
Creatinine: 0.92 mg/dL (ref 0.44–1.00)
GFR, Estimated: >60 mL/min (ref >60)
Glucose, Bld: 139 mg/dL — ABNORMAL HIGH (ref 70–99)
Potassium: 3.9 mmol/L (ref 3.5–5.1)
Sodium: 139 mmol/L (ref 135–145)
Total Bilirubin: 0.5 mg/dL (ref 0.3–1.2)
Total Protein: 7.7 g/dL (ref 6.5–8.1)

## 2021-05-08 LAB — CBC WITH DIFFERENTIAL (CANCER CENTER ONLY)
Abs Immature Granulocytes: 0.02 10*3/uL (ref 0.00–0.07)
Basophils Absolute: 0 10*3/uL (ref 0.0–0.1)
Basophils Relative: 0 %
Eosinophils Absolute: 0.1 10*3/uL (ref 0.0–0.5)
Eosinophils Relative: 1 %
HCT: 42.8 % (ref 36.0–46.0)
Hemoglobin: 13.9 g/dL (ref 12.0–15.0)
Immature Granulocytes: 0 %
Lymphocytes Relative: 30 %
Lymphs Abs: 2 10*3/uL (ref 0.7–4.0)
MCH: 29.6 pg (ref 26.0–34.0)
MCHC: 32.5 g/dL (ref 30.0–36.0)
MCV: 91.1 fL (ref 80.0–100.0)
Monocytes Absolute: 0.4 10*3/uL (ref 0.1–1.0)
Monocytes Relative: 6 %
Neutro Abs: 4.2 10*3/uL (ref 1.7–7.7)
Neutrophils Relative %: 63 %
Platelet Count: 207 10*3/uL (ref 150–400)
RBC: 4.7 MIL/uL (ref 3.87–5.11)
RDW: 13.2 % (ref 11.5–15.5)
WBC Count: 6.8 10*3/uL (ref 4.0–10.5)
nRBC: 0 % (ref 0.0–0.2)

## 2021-05-08 MED ORDER — ANASTROZOLE 1 MG PO TABS: 1.0000 mg | ORAL_TABLET | Freq: Every day | ORAL | 4 refills | Status: DC

## 2021-06-23 DIAGNOSIS — M79642 Pain in left hand: Secondary | ICD-10-CM | POA: Diagnosis not present

## 2021-06-23 DIAGNOSIS — M79641 Pain in right hand: Secondary | ICD-10-CM | POA: Diagnosis not present

## 2021-06-23 DIAGNOSIS — M180 Bilateral primary osteoarthritis of first carpometacarpal joints: Secondary | ICD-10-CM | POA: Diagnosis not present

## 2021-11-04 DIAGNOSIS — Z01419 Encounter for gynecological examination (general) (routine) without abnormal findings: Secondary | ICD-10-CM | POA: Diagnosis not present

## 2021-11-04 DIAGNOSIS — Z124 Encounter for screening for malignant neoplasm of cervix: Secondary | ICD-10-CM | POA: Diagnosis not present

## 2021-11-07 ENCOUNTER — Other Ambulatory Visit: Payer: Self-pay | Admitting: Obstetrics and Gynecology

## 2021-11-07 DIAGNOSIS — Z8739 Personal history of other diseases of the musculoskeletal system and connective tissue: Secondary | ICD-10-CM

## 2022-01-05 DIAGNOSIS — Z1231 Encounter for screening mammogram for malignant neoplasm of breast: Secondary | ICD-10-CM | POA: Diagnosis not present

## 2022-02-18 DIAGNOSIS — I10 Essential (primary) hypertension: Secondary | ICD-10-CM | POA: Diagnosis not present

## 2022-02-18 DIAGNOSIS — Z1331 Encounter for screening for depression: Secondary | ICD-10-CM | POA: Diagnosis not present

## 2022-02-18 DIAGNOSIS — Z853 Personal history of malignant neoplasm of breast: Secondary | ICD-10-CM | POA: Diagnosis not present

## 2022-02-18 DIAGNOSIS — Z0000 Encounter for general adult medical examination without abnormal findings: Secondary | ICD-10-CM | POA: Diagnosis not present

## 2022-02-18 DIAGNOSIS — E039 Hypothyroidism, unspecified: Secondary | ICD-10-CM | POA: Diagnosis not present

## 2022-02-18 DIAGNOSIS — E785 Hyperlipidemia, unspecified: Secondary | ICD-10-CM | POA: Diagnosis not present

## 2022-04-16 ENCOUNTER — Other Ambulatory Visit: Payer: Self-pay | Admitting: Obstetrics and Gynecology

## 2022-04-16 DIAGNOSIS — J019 Acute sinusitis, unspecified: Secondary | ICD-10-CM | POA: Diagnosis not present

## 2022-04-16 DIAGNOSIS — M8580 Other specified disorders of bone density and structure, unspecified site: Secondary | ICD-10-CM

## 2022-04-21 ENCOUNTER — Ambulatory Visit
Admission: RE | Admit: 2022-04-21 | Discharge: 2022-04-21 | Disposition: A | Discharge status: Home / Self Care | Payer: Medicare HMO | Source: Ambulatory Visit | Attending: Obstetrics and Gynecology | Admitting: Obstetrics and Gynecology

## 2022-04-21 DIAGNOSIS — M8589 Other specified disorders of bone density and structure, multiple sites: Secondary | ICD-10-CM | POA: Diagnosis not present

## 2022-04-21 DIAGNOSIS — M8580 Other specified disorders of bone density and structure, unspecified site: Secondary | ICD-10-CM

## 2022-04-21 DIAGNOSIS — Z780 Asymptomatic menopausal state: Secondary | ICD-10-CM | POA: Diagnosis not present

## 2022-04-27 ENCOUNTER — Telehealth: Payer: Self-pay | Admitting: Hematology and Oncology

## 2022-04-27 NOTE — Telephone Encounter (Signed)
Contacted patient to scheduled appointments. Patient is aware of appointments that are scheduled.   

## 2022-04-30 DIAGNOSIS — H5213 Myopia, bilateral: Secondary | ICD-10-CM | POA: Diagnosis not present

## 2022-05-07 ENCOUNTER — Other Ambulatory Visit: Payer: Medicare HMO

## 2022-05-07 ENCOUNTER — Ambulatory Visit: Payer: Medicare HMO | Admitting: Hematology and Oncology

## 2022-05-15 ENCOUNTER — Other Ambulatory Visit: Payer: Self-pay | Admitting: *Deleted

## 2022-05-15 DIAGNOSIS — C50411 Malignant neoplasm of upper-outer quadrant of right female breast: Secondary | ICD-10-CM

## 2022-05-18 ENCOUNTER — Inpatient Hospital Stay: Payer: Medicare HMO | Attending: Hematology and Oncology

## 2022-05-18 ENCOUNTER — Other Ambulatory Visit: Payer: Self-pay

## 2022-05-18 ENCOUNTER — Inpatient Hospital Stay: Payer: Medicare HMO | Admitting: Hematology and Oncology

## 2022-05-18 ENCOUNTER — Encounter: Payer: Self-pay | Admitting: Hematology and Oncology

## 2022-05-18 ENCOUNTER — Other Ambulatory Visit: Payer: Medicare HMO

## 2022-05-18 ENCOUNTER — Ambulatory Visit: Payer: Medicare HMO | Admitting: Hematology and Oncology

## 2022-05-18 VITALS — BP 205/90 | HR 103 | Temp 97.0°F | Resp 22 | Wt 189.6 lb

## 2022-05-18 DIAGNOSIS — Z79899 Other long term (current) drug therapy: Secondary | ICD-10-CM | POA: Insufficient documentation

## 2022-05-18 DIAGNOSIS — Z79811 Long term (current) use of aromatase inhibitors: Secondary | ICD-10-CM | POA: Diagnosis not present

## 2022-05-18 DIAGNOSIS — N952 Postmenopausal atrophic vaginitis: Secondary | ICD-10-CM | POA: Diagnosis not present

## 2022-05-18 DIAGNOSIS — Z9189 Other specified personal risk factors, not elsewhere classified: Secondary | ICD-10-CM | POA: Diagnosis not present

## 2022-05-18 DIAGNOSIS — R232 Flushing: Secondary | ICD-10-CM | POA: Insufficient documentation

## 2022-05-18 DIAGNOSIS — Z170 Estrogen receptor positive status [ER+]: Secondary | ICD-10-CM | POA: Insufficient documentation

## 2022-05-18 DIAGNOSIS — Z888 Allergy status to other drugs, medicaments and biological substances status: Secondary | ICD-10-CM | POA: Insufficient documentation

## 2022-05-18 DIAGNOSIS — M8580 Other specified disorders of bone density and structure, unspecified site: Secondary | ICD-10-CM | POA: Insufficient documentation

## 2022-05-18 DIAGNOSIS — Z8249 Family history of ischemic heart disease and other diseases of the circulatory system: Secondary | ICD-10-CM | POA: Insufficient documentation

## 2022-05-18 DIAGNOSIS — C50411 Malignant neoplasm of upper-outer quadrant of right female breast: Secondary | ICD-10-CM | POA: Insufficient documentation

## 2022-05-18 DIAGNOSIS — Z8541 Personal history of malignant neoplasm of cervix uteri: Secondary | ICD-10-CM | POA: Insufficient documentation

## 2022-05-18 LAB — CMP (CANCER CENTER ONLY)
ALT: 19 U/L (ref 0–44)
AST: 28 U/L (ref 15–41)
Albumin: 4.6 g/dL (ref 3.5–5.0)
Alkaline Phosphatase: 75 U/L (ref 38–126)
Anion gap: 8 (ref 5–15)
BUN: 24 mg/dL — ABNORMAL HIGH (ref 8–23)
CO2: 26 mmol/L (ref 22–32)
Calcium: 10.1 mg/dL (ref 8.9–10.3)
Chloride: 105 mmol/L (ref 98–111)
Creatinine: 0.93 mg/dL (ref 0.44–1.00)
GFR, Estimated: >60 mL/min (ref >60)
Glucose, Bld: 139 mg/dL — ABNORMAL HIGH (ref 70–99)
Potassium: 3.9 mmol/L (ref 3.5–5.1)
Sodium: 139 mmol/L (ref 135–145)
Total Bilirubin: 0.6 mg/dL (ref 0.3–1.2)
Total Protein: 7.1 g/dL (ref 6.5–8.1)

## 2022-05-18 LAB — CBC WITH DIFFERENTIAL (CANCER CENTER ONLY)
Abs Immature Granulocytes: 0.02 10*3/uL (ref 0.00–0.07)
Basophils Absolute: 0 10*3/uL (ref 0.0–0.1)
Basophils Relative: 0 %
Eosinophils Absolute: 0.1 10*3/uL (ref 0.0–0.5)
Eosinophils Relative: 1 %
HCT: 41.6 % (ref 36.0–46.0)
Hemoglobin: 13.8 g/dL (ref 12.0–15.0)
Immature Granulocytes: 0 %
Lymphocytes Relative: 27 %
Lymphs Abs: 2.2 10*3/uL (ref 0.7–4.0)
MCH: 30 pg (ref 26.0–34.0)
MCHC: 33.2 g/dL (ref 30.0–36.0)
MCV: 90.4 fL (ref 80.0–100.0)
Monocytes Absolute: 0.6 10*3/uL (ref 0.1–1.0)
Monocytes Relative: 7 %
Neutro Abs: 5.2 10*3/uL (ref 1.7–7.7)
Neutrophils Relative %: 65 %
Platelet Count: 195 10*3/uL (ref 150–400)
RBC: 4.6 MIL/uL (ref 3.87–5.11)
RDW: 13.7 % (ref 11.5–15.5)
WBC Count: 8.1 10*3/uL (ref 4.0–10.5)
nRBC: 0 % (ref 0.0–0.2)

## 2022-05-18 NOTE — Progress Notes (Signed)
[facility]  [phone] [phone]     ID: Catherine Robbins DOB: 07-Aug-1955  MR#: [id number]  KGU#:[id number]  Patient Care Team: Carol Ada, MD as PCP - General (Family Medicine) Jovita Kussmaul, MD as Consulting Physician (General Surgery) Magrinat, Virgie Dad, MD (Inactive) as Consulting Physician (Oncology) Kyung Rudd, MD as Consulting Physician (Radiation Oncology) Milly Jakob, MD as Consulting Physician (Orthopedic Surgery) Allyn Kenner, DO as Consulting Physician (Obstetrics and Gynecology) OTHER MD:   CHIEF COMPLAINT: Estrogen receptor positive breast cancer  CURRENT TREATMENT: anastrozole   INTERVAL HISTORY: Catherine Robbins returns today for follow-up of her estrogen receptor positive breast cancer.  She continues on anastrozole.  She is tolerating this better.  Hot flashes are less of an issue.  She still has vaginal atrophy symptoms but they are improved with the regular use of emollients. She is very excited about being done with 5 years of antiestrogen therapy.  Most recent bone density with osteopenia.  She continues to do regular exercise with 6 hours of weight training a week.  She is very proud of her current self.  She tells me that her blood pressure is always high when she comes to the doctor.  She denies any chest pain or chest pressure or headaches.  At home with records perfectly normal and she has been compliant with her antihypertensive.  She does not want to have a recheck of her blood pressure.  She denies any new breast changes.  She is overall dissatisfied with the reconstruction and does not want a go through it again.  Rest of the pertinent 10 point ROS reviewed and negative.   COVID 19 VACCINATION STATUS: Refuses vaccines; had COVID June 2022   HISTORY OF CURRENT ILLNESS: From the original intake note:  Catherine Robbins has a history of prior right-sided breast cancer, status post lumpectomy on 07/30/2004 for a 1.1  cm invasive ductal carcinoma, grade 1, with negative margins and a single sentinel lymph node clear.  The tumor was estrogen receptor 93% positive, progesterone receptor 83% positive, with an MIB-1 of 7% and no HER-2 amplification ([id number]).  She received adjuvant radiation radiation. She took tamoxifen for only a month due to poor tolerance, including severe hot flashes. She was switched to exemestane and tolerated this well, taking it for a total of 5 years  More recently she had routine screening mammography on 08/11/2017 showing a possible abnormality in the right breast. She underwent unilateral right diagnostic mammography with tomography and right breast ultrasonography at [facility] on 08/13/2017 showing: breast density category A. By ultrasound, the new 2.6 cm irregular mass in the 11 o'clock right upper outer quadrant  posterior depth located 4 cm from the nipple is likely carcinoma. Additionally, there is a 0.4 cm irregular mass in the right upper outer quadrant anterior depth and 3 cm from the nipple. These masses correlated to mammography findings. Evaluation of the right axilla was unremarkable.  Accordingly on 08/17/2017 she proceeded to biopsy of the right breast mass in question. The pathology from this procedure showed ([id number]): At the 11 o'clock 4 cmfn, invasive ductal carcinoma, grade II. Prognostic indicators significant for: estrogen receptor, 90% positive and progesterone receptor, 100% positive, both with strong staining intensity. Proliferation marker Ki67 at 25%. HER2 not amplified with ratios HER2/CEP17 signals 1.28 and average HER2 copies per cell 2.09.   The patient's subsequent history is as detailed below.   PAST MEDICAL HISTORY: Past Medical History:  Diagnosis Date   Breast cancer  in female [facility])    Right   Cancer [facility])    DES exposure in utero    Hypertension    Hypothyroidism    Low sodium levels    Lyme disease    Von Willebrand disease ([place])    HTN, HLD, Von  Willebrand's Disease, Lyme Disease and Bartonella (chronic since 1998) neuropathy in the feet secondary to the Lyme disease 2004. She is a DES Daughter.    PAST SURGICAL HISTORY: Past Surgical History:  Procedure Laterality Date   BREAST IMPLANT REMOVAL Right 01/27/2018   Procedure: REMOVE AND REPLACE  BREAST IMPLANT - WITH DRAIN PLACEMENT;  Surgeon: Irene Limbo, MD;  Location: [place];  Service: Plastics;  Laterality: Right;   BREAST IMPLANT REMOVAL Right 10/15/2018   Procedure: REMOVAL BREAST IMPLANTS;  Surgeon: Irene Limbo, MD;  Location: [place];  Service: Plastics;  Laterality: Right;   BREAST LUMPECTOMY Right    BREAST RECONSTRUCTION WITH PLACEMENT OF TISSUE EXPANDER AND FLEX HD (ACELLULAR HYDRATED DERMIS) Right 09/27/2017   Procedure: RIGHT BREAST RECONSTRUCTION WITH PLACEMENT OF TISSUE EXPANDER AND FLEX HD (ACELLULAR HYDRATED DERMIS);  Surgeon: Wallace Going, DO;  Location: [place];  Service: Plastics;  Laterality: Right;   BREAST REDUCTION SURGERY Left 01/07/2018   Procedure: breast reduction/mastopexy for symmetry left;  Surgeon: Wallace Going, DO;  Location: [place];  Service: Plastics;  Laterality: Left;   MASTECTOMY W/ SENTINEL NODE BIOPSY Right 09/27/2017   Procedure: RIGHT MASTECTOMY WITH SENTINEL LYMPH NODE BIOPSY;  Surgeon: Jovita Kussmaul, MD;  Location: [place];  Service: General;  Laterality: Right;   REMOVAL OF TISSUE EXPANDER AND PLACEMENT OF IMPLANT Right 01/07/2018   Procedure: REMOVAL OF TISSUE EXPANDER AND PLACEMENT OF IMPLANT;  Surgeon: Wallace Going, DO;  Location: [place];  Service: Plastics;  Laterality: Right;   TONSILLECTOMY    Wisdom teeth extraction in the 29's    FAMILY HISTORY Family History  Problem Relation Age of Onset   DES usage Mother    Other Brother        DES exposure   Heart disease Maternal Grandfather        d. 45   Other Sister        DES exposure   The patient's  father is alive at age 58. The patient's mother died at age 72 from heart disease. The patient has 1 brother and 2 sisters. She denies a history of breast, ovarian, or any other cancers in the family.    GYNECOLOGIC HISTORY:  No LMP recorded. Patient is postmenopausal. Menarche: 38-45 years old Age at first live birth: 66 years old She is GXP2. Her LMP was in 2006. She used oral contraceptives for over 5 years with no complications. She also used HRT around 2005 for about a year until her initial breast cancer diagnosis in 2006.      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 She is also in the process of retiring. Her husband, [name], is retired from Investment banker, corporate. The patient's daughter, Mickel Baas, is a Cabin crew in [place], [place]. The patient's second daughter, Claiborne Billings, works in office and child care in [place]. She has no grandchildren. She is a person of faith, but is private.    ADVANCED DIRECTIVES: The absence of any documents to the contrary the patient's husband is her healthcare power of attorney   HEALTH MAINTENANCE: Social History  Tobacco Use   Smoking status: Former   Smokeless tobacco: Never  Vaping Use   Vaping Use: Never used  Substance Use Topics   Alcohol use: Yes    Comment: Occasional   Drug use: Never     Colonoscopy: 2016/ in [place]  PAP: March 2017/ normal  Bone density: 2018 at Dr. Smith/ osteopenia    Allergies  Allergen Reactions   Bupropion Other (See Comments)    DIZZINESS UNABLE TO STAND   Chlorhexidine Gluconate  [Chlorhexidine] Rash    Developed rash over the chest, neck and underarms where was prepped with Chlorhexidine in the OR   Tamoxifen Other (See Comments)    EXTREME FATIGUE HOT FLASHES SWEATING    Current Outpatient Medications  Medication Sig Dispense Refill   anastrozole (ARIMIDEX) 1 MG tablet  Take 1 tablet (1 mg total) by mouth daily. 90 tablet 4   Ascorbic Acid (VITAMIN C PO) Take by mouth.     Cyanocobalamin (VITAMIN B-12 PO) Take by mouth.     ibuprofen (IBU-200) 200 MG tablet Take 800 mg by mouth as needed (Reports taking sporadic and taking between 200- 800 mg at a time).     losartan (COZAAR) 25 MG tablet Take 1 tablet (25 mg total) by mouth daily.     Multiple Vitamins-Minerals (MULTIVITAMIN ADULT PO) Take by mouth.     NP THYROID 60 MG tablet Take 60 mg by mouth daily before breakfast.      VITAMIN D PO Take by mouth.     Zinc Acetate, Oral, (ZINC ACETATE PO) Take by mouth.     No current facility-administered medications for this visit.    OBJECTIVE: Goeser woman who appears younger than stated age  There were no vitals filed for this visit.     There is no height or weight on file to calculate BMI.   Wt Readings from Last 3 Encounters:  05/08/21 189 lb 3.2 oz (85.8 kg)  05/09/20 182 lb 6.4 oz (82.7 kg)  10/15/18 180 lb (81.6 kg)   Sclerae unicteric, EOMs intact Wearing a mask No other regional adenopathy Breasts: She is status post right mastectomy with no evidence of chest wall recurrence.  Left breast and both axillae are benign.  There is a small nodule at the center of the right mastectomy scar which according to the patient is chronic, no change.  This is my first time examining her and have no comparison.  It does feel like scar tissue.   LAB RESULTS:  CMP     Component Value Date/Time   NA 139 05/08/2021 1352   K 3.9 05/08/2021 1352   CL 103 05/08/2021 1352   CO2 23 05/08/2021 1352   GLUCOSE 139 (H) 05/08/2021 1352   BUN 15 05/08/2021 1352   CREATININE 0.92 05/08/2021 1352   CALCIUM 9.5 05/08/2021 1352   PROT 7.7 05/08/2021 1352   ALBUMIN 4.5 05/08/2021 1352   AST 31 05/08/2021 1352   ALT 23 05/08/2021 1352   ALKPHOS 71 05/08/2021 1352   BILITOT 0.5 05/08/2021 1352   GFRNONAA >60 05/08/2021 1352   GFRAA >60 10/15/2018 1518   GFRAA >60  08/30/2017 1145    Lab Results  Component Value Date   WBC 8.1 05/18/2022   NEUTROABS 5.2 05/18/2022   HGB 13.8 05/18/2022   HCT 41.6 05/18/2022   MCV 90.4 05/18/2022   PLT 195 05/18/2022    No results found for: "LABCA2"  No components found for: "IHKVQQ595"  No results for  input(s): "INR" in the last 168 hours.  No results found for: "LABCA2"  No results found for: "TDH741"  No results found for: "CAN125"  No results found for: "CAN153"  No results found for: "CA2729"  No components found for: "HGQUANT"  No results found for: "CEA1", "CEA" / No results found for: "CEA1", "CEA"   No results found for: "AFPTUMOR"  No results found for: "CHROMOGRNA"  No results found for: "TOTALPROTELP", "ALBUMINELP", "A1GS", "A2GS", "BETS", "BETA2SER", "GAMS", "MSPIKE", "SPEI" (this displays SPEP labs)  No results found for: "KPAFRELGTCHN", "LAMBDASER", "KAPLAMBRATIO" (kappa/lambda light chains)  No results found for: "HGBA", "HGBA2QUANT", "HGBFQUANT", "HGBSQUAN" (Hemoglobinopathy evaluation)   No results found for: "LDH"  No results found for: "IRON", "TIBC", "IRONPCTSAT" (Iron and TIBC)  No results found for: "FERRITIN"  Urinalysis No results found for: "COLORURINE", "APPEARANCEUR", "LABSPEC", "PHURINE", "GLUCOSEU", "HGBUR", "BILIRUBINUR", "KETONESUR", "PROTEINUR", "UROBILINOGEN", "NITRITE", "LEUKOCYTESUR"   STUDIES: DG Bone Density  Result Date: 04/21/2022 EXAM: DUAL X-RAY ABSORPTIOMETRY (DXA) FOR BONE MINERAL DENSITY IMPRESSION: Referring Physician:  Allyn Kenner Your patient completed a bone mineral density test using GE Lunar iDXA system (analysis version: 16). Technologist: sec PATIENT: Name: Thekla, Colborn Patient ID: [id number] Birth Date: 1956/04/04 Height: 69.0 in. Sex: Female Measured: 04/21/2022 Weight: 185.0 lbs. Indications: Estrogen Deficient, Family Hist. (Parent hip fracture), Postmenopausal Fractures: Left Hand Treatments: Vitamin D (E933.5)  ASSESSMENT: The BMD measured at Femur Neck Right is 0.711 g/cm2 with a T-score of -2.4. This patient is considered osteopenic/low bone mass according to [facility]) criteria. The quality of the exam is good. Site Region Measured Date Measured Age YA BMD Significant CHANGE T-score DualFemur Neck Right 04/21/2022 67.7 -2.4 0.711 g/cm2 AP Spine L1-L4 04/21/2022 67.7 -1.2 1.042 g/cm2 DualFemur Total Mean 04/21/2022 67.7 -2.2 0.730 g/cm2 Left Forearm Radius 33% 04/21/2022 67.7 -1.4 0.752 g/cm2 World Health Organization [facility]) criteria for post-menopausal, Caucasian Women: Normal       T-score at or above -1 SD Osteopenia   T-score between -1 and -2.5 SD Osteoporosis T-score at or below -2.5 SD RECOMMENDATION: 1. All patients should optimize calcium and vitamin D intake. 2. Consider FDA-approved medical therapies in postmenopausal women and men aged 66 years and older, based on the following: a. A hip or vertebral (clinical or morphometric) fracture. b. T-score = -2.5 at the femoral neck or spine after appropriate evaluation to exclude secondary causes. c. Low bone mass (T-score between -1.0 and -2.5 at the femoral neck or spine) and a 10-year probability of a hip fracture = 3% or a 10-year probability of a major osteoporosis-related fracture = 20% based on the [place]-adapted WHO algorithm. d. Clinician judgment and/or patient preferences may indicate treatment for people with 10-year fracture probabilities above or below these levels. FOLLOW-UP: Patients with diagnosis of osteoporosis or at high risk for fracture should have regular bone mineral density tests. Patients eligible for Medicare are allowed routine testing every 2 years. The testing frequency can be increased to one year for patients who have rapidly progressing disease, are receiving or discontinuing medical therapy to restore bone mass, or have additional risk factors. I have reviewed this study and agree with the findings. [facility] FRAX* 10-year Probability of Fracture Based on femoral neck BMD: DualFemur (Right) Major Osteoporotic Fracture: 22.1% Hip Fracture:                4.3% Population:                  [place] (Caucasian) Risk  Factors:                Family Hist. (Parent hip fracture) *FRAX is a [place] [employer] for Metabolic Bone Disease, a [place] (WHO) [facility]. ASSESSMENT: The probability of a major osteoporotic fracture is 22.1% within the next ten years. The probability of a hip fracture is 4.3% within the next ten years. Electronically Signed   By: Ammie Ferrier M.D.   On: 04/21/2022 10:24     ELIGIBLE FOR AVAILABLE RESEARCH PROTOCOL: no  ASSESSMENT: 66 y.o. [place], [place] woman  (1) status post right lumpectomy and sentinel lymph node sampling 07/30/2004 for a pT1c pN0, stage IA invasive breast cancer, grade 1, estrogen and progesterone receptor positive, HER-2 not amplified, with an MIB-1 of 7%  (a) s/p adjuvant radiation  (b) intolerant of tamoxifen  (c) on aromasin for 5 years w/o complications  (2) status post right breast upper outer quadrant biopsy 08/17/2017 for a clinical T2 N0, stage IB invasive ductal carcinoma, grade 2, estrogen and progesterone receptor positive, HER-2 not amplified, with an MIB-1 of 25%  (a) a second area of concern measuring 0.4 cm was located in the upper inner quadrant  (3) right mastectomy on 09/27/2017 (Dr. Marlou Starks) showed multifocal invasive ductal carcinoma grade II, 2 tumors, 2.4cm and 0.5cm, margins negative, 2 SLN negative.  pT2, pN0, stage IB  (a) permanent implants (smooth high-profile silicone) placed 29/51/8841  (b) the right chest silicone implant had to be removed and replaced 01/27/2018 because of a persistent seroma  (4) Oncotype DX from 08/17/17: 24, predicting a 9-year risk of recurrence outside the breast of 10% if the patient's only systemic therapy is tamoxifen for 5  years.  It also predicts no significant benefit from chemotherapy.    (5) anastrozole started neoadjuvantly March 2019  (6) genetics testing on 09/08/2017 shows no deleterious mutations.  Genes tested include: APC, ATM, AXIN2, BARD1, BMPR1A, BRCA1, BRCA2, BRIP1, CDH1, CDK4, CDKN2A (p14ARF), CDKN2A (p16INK4a), CHEK2, CTNNA1, DICER1, EPCAM (Deletion/duplication testing only), GREM1 (promoter region deletion/duplication testing only), KIT, MEN1, MLH1, MSH2, MSH3, MSH6, MUTYH, NBN, NF1, NHTL1, PALB2, PDGFRA, PMS2, POLD1, POLE, PTEN, RAD50, RAD51C, RAD51D, SDHB, SDHC, SDHD, SMAD4, SMARCA4. STK11, TP53, TSC1, TSC2, and VHL.  The following genes were evaluated for sequence changes only: SDHA and HOXB13 c.251G>A variant only.  (7) history of von Willebrand disease  (8) history of DES intrauterine exposure   PLAN:   She is tolerating anastrozole moderately well.  The plan is to continue that for total of 5 years.  She will complete anastrozole in March 2024 Given her in utero history of DES exposure, she should continue annual cervical exam and cervical cancer and vaginal cancer screening with her gynecologist. At this time there is no concern for breast cancer recurrence.  She would rather come back as needed.   She tells me that it is a very traumatic thing for her to keep coming here since she had to go through breast cancer for almost 10 years. I think this is reasonable.  She was encouraged to call us.  Bone density most recently showed osteopenia.  Encouraged her weight training.  She wants to continue monitoring at at this time.  Encounter time: 30 minutes  *Total Encounter Time as defined by the Centers for Medicare and Medicaid Services includes, in addition to the face-to-face time of a patient visit (documented in the note above) non-face-to-face time: obtaining and reviewing outside history, ordering and  reviewing medications, tests or procedures, care coordination (communications with other  health care professionals or caregivers) and documentation in the medical record.

## 2022-06-03 DIAGNOSIS — R69 Illness, unspecified: Secondary | ICD-10-CM | POA: Diagnosis not present

## 2022-06-25 DIAGNOSIS — R69 Illness, unspecified: Secondary | ICD-10-CM | POA: Diagnosis not present

## 2022-07-14 NOTE — Progress Notes (Signed)
CARDIOLOGY CONSULT NOTE       Patient ID: Catherine Robbins MRN: [id number] DOB/AGE: August 01, 1955 67 y.o.  Admit date: (Not on file) Referring Physician: Tamala Julian Primary Physician: Carol Ada, MD Primary Cardiologist: New Reason for Consultation: HLD/Family history CAD   HPI:  67 y.o. referred by Dr Tamala Julian for HLD and family history of premature CAD Her 60 yo brother just died of MI She has a history of breast cancer, Von Willebrand's and hypothyroidism Her LDL is 127 on on Rx or statin She is out 5 years from anti estrogen Rx She had reconstructive surgery and radiation Rx  She is very active at [facility] and at home with no symptoms She is retired Fish farm manager disability advocate 2 older children one in [place] Husband is home and has rate controlled afib having seen Dr Rayann Heman in past   ROS All other systems reviewed and negative except as noted above  Past Medical History:  Diagnosis Date   Breast cancer in female [facility])    Right   Cancer ([place])    DES exposure in utero    Hypertension    Hypothyroidism    Low sodium levels    Lyme disease    Von Willebrand disease ([place])     Family History  Problem Relation Age of Onset   DES usage Mother    Other Brother        DES exposure   Heart disease Maternal Grandfather        d. 14   Other Sister        DES exposure    Social History   Socioeconomic History   Marital status: Married    Spouse name: Not on file   Number of children: 2   Years of education: Not on file   Highest education level: Not on file  Occupational History   Not on file  Tobacco Use   Smoking status: Former   Smokeless tobacco: Never  Vaping Use   Vaping Use: Never used  Substance and Sexual Activity   Alcohol use: Yes    Comment: Occasional   Drug use: Never   Sexual activity: Not on file  Other Topics Concern   Not on file  Social History Narrative   Not on file   Social Determinants of Health   Financial Resource Strain: Not on  file  Food Insecurity: Not on file  Transportation Needs: Not on file  Physical Activity: Not on file  Stress: Not on file  Social Connections: Not on file  Intimate Partner Violence: Not on file    Past Surgical History:  Procedure Laterality Date   BREAST IMPLANT REMOVAL Right 01/27/2018   Procedure: REMOVE AND REPLACE  BREAST IMPLANT - WITH DRAIN PLACEMENT;  Surgeon: Irene Limbo, MD;  Location: [place];  Service: Plastics;  Laterality: Right;   BREAST IMPLANT REMOVAL Right 10/15/2018   Procedure: REMOVAL BREAST IMPLANTS;  Surgeon: Irene Limbo, MD;  Location: [place];  Service: Plastics;  Laterality: Right;   BREAST LUMPECTOMY Right    BREAST RECONSTRUCTION WITH PLACEMENT OF TISSUE EXPANDER AND FLEX HD (ACELLULAR HYDRATED DERMIS) Right 09/27/2017   Procedure: RIGHT BREAST RECONSTRUCTION WITH PLACEMENT OF TISSUE EXPANDER AND FLEX HD (ACELLULAR HYDRATED DERMIS);  Surgeon: Wallace Going, DO;  Location: [place];  Service: Plastics;  Laterality: Right;   BREAST REDUCTION SURGERY Left 01/07/2018   Procedure: breast reduction/mastopexy for symmetry left;  Surgeon: Wallace Going, DO;  Location: [place];  Service: Clinical cytogeneticist;  Laterality: Left;   MASTECTOMY W/ SENTINEL NODE BIOPSY Right 09/27/2017   Procedure: RIGHT MASTECTOMY WITH SENTINEL LYMPH NODE BIOPSY;  Surgeon: Jovita Kussmaul, MD;  Location: [place];  Service: General;  Laterality: Right;   REMOVAL OF TISSUE EXPANDER AND PLACEMENT OF IMPLANT Right 01/07/2018   Procedure: REMOVAL OF TISSUE EXPANDER AND PLACEMENT OF IMPLANT;  Surgeon: Wallace Going, DO;  Location: [place];  Service: Plastics;  Laterality: Right;   TONSILLECTOMY        Current Outpatient Medications:    Ascorbic Acid (VITAMIN C PO), Take by mouth., Disp: , Rfl:    losartan (COZAAR) 25 MG tablet, Take 50 mg by mouth daily., Disp: , Rfl:    Multiple Vitamins-Minerals (MULTIVITAMIN ADULT PO), Take by  mouth., Disp: , Rfl:    NP THYROID 60 MG tablet, Take 60 mg by mouth daily before breakfast. , Disp: , Rfl:    VITAMIN D PO, Take by mouth., Disp: , Rfl:    Zinc Acetate, Oral, (ZINC ACETATE PO), Take by mouth., Disp: , Rfl:     Physical Exam: Blood pressure (!) 200/110, pulse 87, height '5\' 10"'$  (1.778 m), weight 189 lb (85.7 kg), SpO2 99 %.    Affect appropriate Healthy:  appears stated age 32: normal Neck supple with no adenopathy JVP normal no bruits no thyromegaly Lungs clear with no wheezing and good diaphragmatic motion Heart:  S1/S2 2/6 SEM murmur, no rub, gallop or click PMI normal post right breast surgery  Abdomen: benighn, BS positve, no tenderness, no AAA no bruit.  No HSM or HJR Distal pulses intact with no bruits No edema Neuro non-focal Skin warm and dry No muscular weakness   Labs:   Lab Results  Component Value Date   WBC 8.1 05/18/2022   HGB 13.8 05/18/2022   HCT 41.6 05/18/2022   MCV 90.4 05/18/2022   PLT 195 05/18/2022     Radiology: No results found.  EKG: 2019 SR anterolateral ST changes  07/28/2022 SR rate 73 low voltage borderline ECG   ASSESSMENT AND PLAN:   HLD/CAD risk:  Discussed utility of coronary calcium score to further risk stratify Given family history of premature CAD and LDL of 120 woiuld start on statin  increase dose of statin f/u with primary as target LDL is < 70  ? On 40 mg lipitor now  Breast Cancer:  post mastectomy on right with adjuvant anti estorgen Rx done, and XRT Thyroid:  TSH normal continue NP thyroid replacement HTN:  normal continue ARB Von Willebrand's :  f/u hematology no clinically abnormal bleeding Murmur:  2/6 SEM f/u TTE    Calcium score TTE   F/U after testing     Signed: Jenkins Rouge 07/28/2022, 2:20 PM

## 2022-07-28 ENCOUNTER — Ambulatory Visit: Payer: Medicare HMO | Attending: Cardiovascular Disease | Admitting: Cardiovascular Disease

## 2022-07-28 ENCOUNTER — Encounter: Payer: Self-pay | Admitting: Cardiovascular Disease

## 2022-07-28 VITALS — BP 200/110 | HR 87 | Ht 70.0 in | Wt 189.0 lb

## 2022-07-28 DIAGNOSIS — I10 Essential (primary) hypertension: Secondary | ICD-10-CM

## 2022-07-28 DIAGNOSIS — E785 Hyperlipidemia, unspecified: Secondary | ICD-10-CM

## 2022-07-28 DIAGNOSIS — Z8249 Family history of ischemic heart disease and other diseases of the circulatory system: Secondary | ICD-10-CM | POA: Diagnosis not present

## 2022-07-28 DIAGNOSIS — R0600 Dyspnea, unspecified: Secondary | ICD-10-CM

## 2022-07-28 NOTE — Patient Instructions (Signed)
Medication Instructions:  Your physician recommends that you continue on your current medications as directed. Please refer to the Current Medication list given to you today.  *If you need a refill on your cardiac medications before your next appointment, please call your pharmacy*  Lab Work: If you have labs (blood work) drawn today and your tests are completely normal, you will receive your results only by: [place] (if you have MyChart) OR A paper copy in the mail If you have any lab test that is abnormal or we need to change your treatment, we will call you to review the results.  Testing/Procedures: Your physician has requested that you have CT for calcium score. Cardiac computed tomography (CT) is a painless test that uses an x-ray machine to take clear, detailed pictures of your heart. For further information please visit [email or web address]. Please follow instruction sheet as given.  Your physician has requested that you have an echocardiogram. Echocardiography is a painless test that uses sound waves to create images of your heart. It provides your doctor with information about the size and shape of your heart and how well your heart's chambers and valves are working. This procedure takes approximately one hour. There are no restrictions for this procedure. Please do NOT wear cologne, perfume, aftershave, or lotions (deodorant is allowed). Please arrive 15 minutes prior to your appointment time.  Follow-Up: At [facility], you and your health needs are our priority.  As part of our continuing mission to provide you with exceptional heart care, we have created designated Provider Care Teams.  These Care Teams include your primary Cardiologist (physician) and Advanced Practice Providers (APPs -  Physician Assistants and Nurse Practitioners) who all work together to provide you with the care you need, when you need it.  We recommend signing up for the patient portal called  "MyChart".  Sign up information is provided on this After Visit Summary.  MyChart is used to connect with patients for Virtual Visits (Telemedicine).  Patients are able to view lab/test results, encounter notes, upcoming appointments, etc.  Non-urgent messages can be sent to your provider as well.   To learn more about what you can do with MyChart, go to [email or web address].    Your next appointment:   After test are complete  Provider:   Jenkins Rouge, MD

## 2022-08-06 DIAGNOSIS — M180 Bilateral primary osteoarthritis of first carpometacarpal joints: Secondary | ICD-10-CM | POA: Diagnosis not present

## 2022-08-06 DIAGNOSIS — M1811 Unilateral primary osteoarthritis of first carpometacarpal joint, right hand: Secondary | ICD-10-CM | POA: Diagnosis not present

## 2022-08-06 DIAGNOSIS — M1812 Unilateral primary osteoarthritis of first carpometacarpal joint, left hand: Secondary | ICD-10-CM | POA: Diagnosis not present

## 2022-08-09 DIAGNOSIS — R69 Illness, unspecified: Secondary | ICD-10-CM | POA: Diagnosis not present

## 2022-08-10 DIAGNOSIS — R69 Illness, unspecified: Secondary | ICD-10-CM | POA: Diagnosis not present

## 2022-08-20 DIAGNOSIS — Z853 Personal history of malignant neoplasm of breast: Secondary | ICD-10-CM | POA: Diagnosis not present

## 2022-08-20 DIAGNOSIS — E039 Hypothyroidism, unspecified: Secondary | ICD-10-CM | POA: Diagnosis not present

## 2022-08-20 DIAGNOSIS — E785 Hyperlipidemia, unspecified: Secondary | ICD-10-CM | POA: Diagnosis not present

## 2022-08-20 DIAGNOSIS — I10 Essential (primary) hypertension: Secondary | ICD-10-CM | POA: Diagnosis not present

## 2022-08-31 ENCOUNTER — Ambulatory Visit (HOSPITAL_BASED_OUTPATIENT_CLINIC_OR_DEPARTMENT_OTHER)
Admission: RE | Admit: 2022-08-31 | Discharge: 2022-08-31 | Disposition: A | Discharge status: Home / Self Care | Payer: Medicare HMO | Source: Ambulatory Visit | Attending: Cardiovascular Disease | Admitting: Cardiovascular Disease

## 2022-08-31 ENCOUNTER — Ambulatory Visit (INDEPENDENT_AMBULATORY_CARE_PROVIDER_SITE_OTHER): Payer: Medicare HMO

## 2022-08-31 DIAGNOSIS — E785 Hyperlipidemia, unspecified: Secondary | ICD-10-CM | POA: Insufficient documentation

## 2022-08-31 DIAGNOSIS — Z8249 Family history of ischemic heart disease and other diseases of the circulatory system: Secondary | ICD-10-CM | POA: Diagnosis not present

## 2022-08-31 DIAGNOSIS — R0600 Dyspnea, unspecified: Secondary | ICD-10-CM | POA: Diagnosis not present

## 2022-08-31 DIAGNOSIS — I10 Essential (primary) hypertension: Secondary | ICD-10-CM | POA: Insufficient documentation

## 2022-08-31 LAB — ECHOCARDIOGRAM COMPLETE
AR max vel: 2.56 cm2
AV Area VTI: 2.79 cm2
AV Area mean vel: 2.58 cm2
AV Mean grad: 6 mmHg
AV Peak grad: 11.4 mmHg
Ao pk vel: 1.69 m/s
Area-P 1/2: 3.72 cm2
S' Lateral: 3.49 cm

## 2022-08-31 NOTE — Progress Notes (Signed)
CARDIOLOGY CONSULT NOTE       Patient ID: Catherine Robbins MRN: [id number] DOB/AGE: 1955-11-10 67 y.o.  Admit date: (Not on file) Referring Physician: Katrinka Blazing Primary Physician: Merri Brunette, MD Primary Cardiologist: Eden Emms Reason for Consultation: HLD/Family history CAD   HPI:  67 y.o. 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 one in [place] Husband is home and has rate controlled afib having seen Dr Johney Frame in past   Calcium Score 08/31/22:  0 TTE 08/31/22 : EF 55-60% mild AV sclerosis otherwise normal   Labs LDL 122 before zetia started   Discussed need to f/u with primary for full lung CT and CT abdomen with contrast for radiology over-read findings A copy of these reports given to patient to have Dr Katrinka Blazing review  ROS All other systems reviewed and negative except as noted above  Past Medical History:  Diagnosis Date   Breast cancer in female    Right   Cancer    DES exposure in utero    Hypertension    Hypothyroidism    Low sodium levels    Lyme disease    Von Willebrand disease     Family History  Problem Relation Age of Onset   DES usage Mother    Other Brother        DES exposure   Heart disease Maternal Grandfather        d. 38   Other Sister        DES exposure    Social History   Socioeconomic History   Marital status: Married    Spouse name: Not on file   Number of children: 2   Years of education: Not on file   Highest education level: Not on file  Occupational History   Not on file  Tobacco Use   Smoking status: Former   Smokeless tobacco: Never   Vaping Use   Vaping Use: Never used  Substance and Sexual Activity   Alcohol use: Yes    Comment: Occasional   Drug use: Never   Sexual activity: Not on file  Other Topics Concern   Not on file  Social History Narrative   Not on file   Social Determinants of Health   Financial Resource Strain: Not on file  Food Insecurity: Not on file  Transportation Needs: Not on file  Physical Activity: Not on file  Stress: Not on file  Social Connections: Not on file  Intimate Partner Violence: Not on file    Past Surgical History:  Procedure Laterality Date   BREAST IMPLANT REMOVAL Right 01/27/2018   Procedure: REMOVE AND REPLACE  BREAST IMPLANT - WITH DRAIN PLACEMENT;  Surgeon: Glenna Fellows, MD;  Location:  SURGERY CENTER;  Service: Plastics;  Laterality: Right;   BREAST IMPLANT REMOVAL Right 10/15/2018   Procedure: REMOVAL BREAST IMPLANTS;  Surgeon: Glenna Fellows, MD;  Location: [facility] OR;  Service: Plastics;  Laterality: Right;   BREAST LUMPECTOMY Right    BREAST RECONSTRUCTION WITH PLACEMENT OF TISSUE EXPANDER AND FLEX HD (ACELLULAR HYDRATED DERMIS) Right 09/27/2017  Procedure: RIGHT BREAST RECONSTRUCTION WITH PLACEMENT OF TISSUE EXPANDER AND FLEX HD (ACELLULAR HYDRATED DERMIS);  Surgeon: Peggye Form, DO;  Location: [facility] OR;  Service: Plastics;  Laterality: Right;   BREAST REDUCTION SURGERY Left 01/07/2018   Procedure: breast reduction/mastopexy for symmetry left;  Surgeon: Peggye Form, DO;  Location: [facility];  Service: Plastics;  Laterality: Left;   MASTECTOMY W/ SENTINEL NODE BIOPSY Right 09/27/2017   Procedure: RIGHT MASTECTOMY WITH SENTINEL LYMPH NODE BIOPSY;  Surgeon: Griselda Miner, MD;  Location: [facility];  Service: General;  Laterality: Right;   REMOVAL OF TISSUE EXPANDER AND PLACEMENT OF IMPLANT Right 01/07/2018   Procedure: REMOVAL OF TISSUE EXPANDER AND PLACEMENT OF IMPLANT;  Surgeon: Peggye Form, DO;  [facility];  Service: Plastics;  Laterality: Right;   TONSILLECTOMY        Current Outpatient Medications:    rosuvastatin (CRESTOR) 20 MG tablet, Take 20 mg by mouth 2 (two) times a week., Disp: , Rfl:    Ascorbic Acid (VITAMIN C PO), Take by mouth., Disp: , Rfl:    Coenzyme Q10 (CO Q 10) 100 MG CAPS, Take 100 mg by mouth daily., Disp: , Rfl:    ezetimibe (ZETIA) 10 MG tablet, Take 10 mg by mouth daily., Disp: , Rfl:    losartan (COZAAR) 50 MG tablet, Take 50 mg by mouth daily., Disp: , Rfl:    Menaquinone-7 (VITAMIN K2) 100 MCG CAPS, Take 100 mg by mouth daily., Disp: , Rfl:    Multiple Vitamins-Minerals (MULTIVITAMIN ADULT PO), Take by mouth., Disp: , Rfl:    NP THYROID 60 MG tablet, Take 60 mg by mouth daily before breakfast. , Disp: , Rfl:    VITAMIN D PO, Take by mouth., Disp: , Rfl:    Zinc Acetate, Oral, (ZINC ACETATE PO), Take by mouth., Disp: , Rfl:     Physical Exam: Blood pressure (!) 152/80, pulse 68, height  (1.778 m), weight 182 lb (82.6 kg), SpO2 98 %.    Affect appropriate Healthy:  appears stated age HEENT: normal Neck supple with no adenopathy JVP normal no bruits no thyromegaly Lungs clear with no wheezing and good diaphragmatic motion Heart:  S1/S2 2/6 SEM murmur, no rub, gallop or click PMI normal post right breast surgery  Abdomen: benighn, BS positve, no tenderness, no AAA no bruit.  No HSM or HJR Distal pulses intact with no bruits No edema Neuro non-focal Skin warm and dry No muscular weakness   Labs:   Lab Results  Component Value Date   WBC 8.1 05/18/2022   HGB 13.8 05/18/2022   HCT 41.6 05/18/2022   MCV 90.4 05/18/2022   PLT 195 05/18/2022     Radiology: CT CARDIAC SCORING (SELF PAY ONLY)  Addendum Date: 09/02/2022   ADDENDUM REPORT: 09/02/2022 11:44 EXAM: OVER-READ INTERPRETATION  CT CHEST The following report is an over-read performed by radiologist Dr. Curly Shores [facility] on 09/02/2022. This over-read  does not include interpretation of cardiac or coronary anatomy or pathology. The coronary calcium score interpretation by the cardiologist is attached. COMPARISON:  None. FINDINGS: Cardiovascular: No significant cardiovascular abnormalities identified without contrast. Mediastinum/Nodes: No suspicious adenopathy identified. Imaged mediastinal structures are unremarkable. Lungs/Pleura: Post XRT scarring identified anterior right hemithorax. Irregularly marginated nodule left lower lobe measuring 7 mm. CT of the chest recommended to evaluate for additional lesions as a precaution. Upper Abdomen: Hepatic lesions identified measuring 3.5 cm in the left lobe and 1.6 cm  in the right lobe. Evaluation of the abdomen with a contrast study recommended for further evaluation. Musculoskeletal: Thoracic degenerative changes. Postop changes right mastectomy. IMPRESSION: 1. Indeterminate nodule in the left lower lobe. CT recommended to evaluate for additional lung lesions. 2. Postop changes mastectomy on the right and post XRT scarring in the right lung. 3. Indeterminate hepatic lesions that should be evaluated further with on examination of the abdomen including contrast. Electronically Signed   By: Layla Maw M.D.   On: 09/02/2022 11:44   Result Date: 09/02/2022 CLINICAL DATA:  63F for cardiovascular disease risk stratification EXAM: Coronary Calcium Score TECHNIQUE: A gated, non-contrast computed tomography scan of the heart was performed using 3mm slice thickness. Axial images were analyzed on a dedicated workstation. Calcium scoring of the coronary arteries was performed using the Agatston method. FINDINGS: Coronary Calcium Score: 0 Pericardium: Normal. Non-cardiac: See separate report from [facility]. IMPRESSION: 1. Coronary calcium score of 0. This was 0 percentile for age-, race-, and sex-matched controls. RECOMMENDATIONS: Coronary artery calcium (CAC) score is a strong predictor of incident coronary heart  disease (CHD) and provides predictive information beyond traditional risk factors. CAC scoring is reasonable to use in the decision to withhold, postpone, or initiate statin therapy in intermediate-risk or selected borderline-risk asymptomatic adults (age 39-75 years and LDL-C >=70 to <190 mg/dL) who do not have diabetes or established atherosclerotic cardiovascular disease (ASCVD).* In intermediate-risk (10-year ASCVD risk >=7.5% to <20%) adults or selected borderline-risk (10-year ASCVD risk >=5% to <7.5%) adults in whom a CAC score is measured for the purpose of making a treatment decision the following recommendations have been made: If CAC=0, it is reasonable to withhold statin therapy and reassess in 5 to 10 years, as long as higher risk conditions are absent (diabetes mellitus, family history of premature CHD in first degree relatives (males <55 years; females <65 years), cigarette smoking, or LDL >=190 mg/dL). If CAC is 1 to 99, it is reasonable to initiate statin therapy for patients >=69 years of age. If CAC is >=100 or >=75th percentile, it is reasonable to initiate statin therapy at any age. Cardiology referral should be considered for patients with CAC scores >=400 or >=75th percentile. *2018 AHA/ACC/AACVPR/AAPA/ABC/ACPM/ADA/AGS/APhA/ASPC/NLA/PCNA Guideline on the Management of Blood Cholesterol: A Report of the American College of Cardiology/American Heart Association Task Force on Clinical Practice Guidelines. J Am Coll Cardiol. 2019;73(24):3168-3209. Chilton Si, MD Electronically Signed: By: Chilton Si M.D. On: 08/31/2022 17:58   ECHOCARDIOGRAM COMPLETE  Result Date: 08/31/2022    ECHOCARDIOGRAM REPORT   Patient Name:   JALAYIAH BIBIAN Kuna Date of Exam: 08/31/2022 Medical Rec #:  [id number]         Height:       70.0 in Accession #:    [id number]        Weight:       189.0 lb Date of Birth:  February 13, 1956          BSA:          2.038 m Patient Age:    67 years          BP:           140/75  mmHg Patient Gender: F                 HR:           73 bpm. Exam Location:  Outpatient Procedure: 2D Echo, 3D Echo, Cardiac Doppler, Color Doppler and Strain Analysis Indications:    Dyspnea  History:        Patient has no prior history of Echocardiogram examinations.                 Risk Factors:Former Smoker and Dyslipidemia. Right breast                 cancer; Von Willebrand disease.  Sonographer:    Jeryl Columbia RDCS Referring Phys: [id number]ade IMPRESSIONS  1. Left ventricular ejection fraction, by estimation, is 55 to 60%. The left ventricle has normal function. The left ventricle has no regional wall motion abnormalities. Left ventricular diastolic parameters are consistent with Grade I diastolic dysfunction (impaired relaxation).  2. Right ventricular systolic function is normal. The right ventricular size is normal.  3. The mitral valve is abnormal. No evidence of mitral valve regurgitation. No evidence of mitral stenosis.  4. The aortic valve is tricuspid. There is mild calcification of the aortic valve. There is mild thickening of the aortic valve. Aortic valve regurgitation is not visualized. Aortic valve sclerosis is present, with no evidence of aortic valve stenosis.  5. The inferior vena cava is normal in size with greater than 50% respiratory variability, suggesting right atrial pressure of 3 mmHg. FINDINGS  Left Ventricle: Left ventricular ejection fraction, by estimation, is 55 to 60%. The left ventricle has normal function. The left ventricle has no regional wall motion abnormalities. The left ventricular internal cavity size was normal in size. There is  no left ventricular hypertrophy. Left ventricular diastolic parameters are consistent with Grade I diastolic dysfunction (impaired relaxation). Right Ventricle: The right ventricular size is normal. No increase in right ventricular wall thickness. Right ventricular systolic function is normal. Left Atrium: Left atrial size was normal  in size. Right Atrium: Right atrial size was normal in size. Pericardium: There is no evidence of pericardial effusion. Mitral Valve: The mitral valve is abnormal. There is mild thickening of the mitral valve leaflet(s). Mild mitral annular calcification. No evidence of mitral valve regurgitation. No evidence of mitral valve stenosis. Tricuspid Valve: The tricuspid valve is normal in structure. Tricuspid valve regurgitation is not demonstrated. No evidence of tricuspid stenosis. Aortic Valve: The aortic valve is tricuspid. There is mild calcification of the aortic valve. There is mild thickening of the aortic valve. Aortic valve regurgitation is not visualized. Aortic valve sclerosis is present, with no evidence of aortic valve stenosis. Aortic valve mean gradient measures 6.0 mmHg. Aortic valve peak gradient measures 11.4 mmHg. Aortic valve area, by VTI measures 2.79 cm. Pulmonic Valve: The pulmonic valve was normal in structure. Pulmonic valve regurgitation is trivial. No evidence of pulmonic stenosis. Aorta: The aortic root is normal in size and structure. Venous: The inferior vena cava is normal in size with greater than 50% respiratory variability, suggesting right atrial pressure of 3 mmHg. IAS/Shunts: The interatrial septum was not well visualized.  LEFT VENTRICLE PLAX 2D LVIDd:         5.02 cm   Diastology LVIDs:         3.49 cm   LV e' medial:    6.31 cm/s LV PW:         0.79 cm   LV E/e' medial:  12.5 LV IVS:        0.99 cm   LV e' lateral:   9.14 cm/s LVOT diam:     2.10 cm   LV E/e' lateral: 8.6 LV SV:         93 LV SV Index:  46 LVOT Area:     3.46 cm                           3D Volume EF:                          3D EF:        58 %                          LV EDV:       106 ml                          LV ESV:       44 ml                          LV SV:        62 ml RIGHT VENTRICLE RV Basal diam:  4.38 cm RV Mid diam:    2.93 cm RV S prime:     16.40 cm/s TAPSE (M-mode): 4.0 cm LEFT ATRIUM              Index        RIGHT ATRIUM          Index LA diam:        3.70 cm 1.82 cm/m   RA Area:     9.84 cm LA Vol (A2C):   25.9 ml 12.71 ml/m  RA Volume:   18.70 ml 9.18 ml/m LA Vol (A4C):   44.6 ml 21.89 ml/m LA Biplane Vol: 35.4 ml 17.37 ml/m  AORTIC VALVE AV Area (Vmax):    2.56 cm AV Area (Vmean):   2.58 cm AV Area (VTI):     2.79 cm AV Vmax:           169.00 cm/s AV Vmean:          109.000 cm/s AV VTI:            0.333 m AV Peak Grad:      11.4 mmHg AV Mean Grad:      6.0 mmHg LVOT Vmax:         125.00 cm/s LVOT Vmean:        81.200 cm/s LVOT VTI:          0.268 m LVOT/AV VTI ratio: 0.80  AORTA Ao Root diam: 2.90 cm Ao Asc diam:  2.90 cm MITRAL VALVE MV Area (PHT): 3.72 cm     SHUNTS MV Decel Time: 204 msec     Systemic VTI:  0.27 m MV E velocity: 78.60 cm/s   Systemic Diam: 2.10 cm MV A velocity: 112.00 cm/s MV E/A ratio:  0.70 Charlton Haws MD Electronically signed by Charlton Haws MD Signature Date/Time: 08/31/2022/3:14:30 PM    Final     EKG: 2019 SR anterolateral ST changes  09/04/2022 SR rate 73 low voltage borderline ECG   ASSESSMENT AND PLAN:   HLD/CAD risk:  Family history and LDL 120 calcium score 0 continue statin and zetia added  Breast Cancer:  post mastectomy on right with adjuvant anti estorgen Rx done, and XRT Thyroid:  TSH normal continue NP thyroid replacement HTN:  normal continue ARB Von Willebrand's :  f/u hematology no clinically abnormal bleeding Murmur:  2/6 SEM only AV sclerosis    F/U PRN  SignedCharlton Haws 09/04/2022, 10:27 AM

## 2022-09-04 ENCOUNTER — Encounter: Payer: Self-pay | Admitting: Cardiovascular Disease

## 2022-09-04 ENCOUNTER — Ambulatory Visit: Payer: Medicare HMO | Attending: Cardiovascular Disease | Admitting: Cardiovascular Disease

## 2022-09-04 VITALS — BP 152/80 | HR 68 | Ht 70.0 in | Wt 182.0 lb

## 2022-09-04 DIAGNOSIS — R011 Cardiac murmur, unspecified: Secondary | ICD-10-CM

## 2022-09-04 DIAGNOSIS — E782 Mixed hyperlipidemia: Secondary | ICD-10-CM | POA: Diagnosis not present

## 2022-09-04 DIAGNOSIS — I10 Essential (primary) hypertension: Secondary | ICD-10-CM | POA: Diagnosis not present

## 2022-09-04 NOTE — Patient Instructions (Signed)
Medication Instructions:  Your physician recommends that you continue on your current medications as directed. Please refer to the Current Medication list given to you today.  *If you need a refill on your cardiac medications before your next appointment, please call your pharmacy*   Lab Work: NONE If you have labs (blood work) drawn today and your tests are completely normal, you will receive your results only by: MyChart Message (if you have MyChart) OR A paper copy in the mail If you have any lab test that is abnormal or we need to change your treatment, we will call you to review the results.   Testing/Procedures: NONE   Follow-Up: At [facility], you and your health needs are our priority.  As part of our continuing mission to provide you with exceptional heart care, we have created designated Provider Care Teams.  These Care Teams include your primary Cardiologist (physician) and Advanced Practice Providers (APPs -  Physician Assistants and Nurse Practitioners) who all work together to provide you with the care you need, when you need it.  We recommend signing up for the patient portal called "MyChart".  Sign up information is provided on this After Visit Summary.  MyChart is used to connect with patients for Virtual Visits (Telemedicine).  Patients are able to view lab/test results, encounter notes, upcoming appointments, etc.  Non-urgent messages can be sent to your provider as well.   To learn more about what you can do with MyChart, go to [email or web address].    Your next appointment:   As Needed  Provider:   Charlton Haws, MD

## 2022-09-09 ENCOUNTER — Other Ambulatory Visit: Payer: Self-pay | Admitting: Family Medicine

## 2022-09-09 DIAGNOSIS — K769 Liver disease, unspecified: Secondary | ICD-10-CM

## 2022-09-09 DIAGNOSIS — R911 Solitary pulmonary nodule: Secondary | ICD-10-CM

## 2022-09-17 ENCOUNTER — Encounter: Payer: Self-pay | Admitting: Family Medicine

## 2022-09-23 DIAGNOSIS — R69 Illness, unspecified: Secondary | ICD-10-CM | POA: Diagnosis not present

## 2022-10-12 ENCOUNTER — Ambulatory Visit
Admission: RE | Admit: 2022-10-12 | Discharge: 2022-10-12 | Disposition: A | Discharge status: Home / Self Care | Payer: Medicare HMO | Source: Ambulatory Visit | Attending: Family Medicine | Admitting: Family Medicine

## 2022-10-12 DIAGNOSIS — I700 Atherosclerosis of aorta: Secondary | ICD-10-CM | POA: Diagnosis not present

## 2022-10-12 DIAGNOSIS — R911 Solitary pulmonary nodule: Secondary | ICD-10-CM

## 2022-10-12 DIAGNOSIS — K769 Liver disease, unspecified: Secondary | ICD-10-CM

## 2022-10-12 DIAGNOSIS — Z853 Personal history of malignant neoplasm of breast: Secondary | ICD-10-CM | POA: Diagnosis not present

## 2022-10-12 MED ORDER — IOPAMIDOL (ISOVUE-300) INJECTION 61%
100.0000 mL | Freq: Once | INTRAVENOUS | Status: AC | PRN
  Administered 2022-10-12: 100 mL via INTRAVENOUS

## 2022-11-23 DIAGNOSIS — R69 Illness, unspecified: Secondary | ICD-10-CM | POA: Diagnosis not present

## 2023-01-19 DIAGNOSIS — Z1231 Encounter for screening mammogram for malignant neoplasm of breast: Secondary | ICD-10-CM | POA: Diagnosis not present

## 2023-01-25 DIAGNOSIS — R222 Localized swelling, mass and lump, trunk: Secondary | ICD-10-CM | POA: Diagnosis not present

## 2023-01-25 DIAGNOSIS — L03313 Cellulitis of chest wall: Secondary | ICD-10-CM | POA: Diagnosis not present

## 2023-01-28 ENCOUNTER — Other Ambulatory Visit: Payer: Self-pay | Admitting: Family Medicine

## 2023-01-28 DIAGNOSIS — R911 Solitary pulmonary nodule: Secondary | ICD-10-CM

## 2023-02-03 DIAGNOSIS — R222 Localized swelling, mass and lump, trunk: Secondary | ICD-10-CM | POA: Diagnosis not present

## 2023-02-03 DIAGNOSIS — R922 Inconclusive mammogram: Secondary | ICD-10-CM | POA: Diagnosis not present

## 2023-02-03 DIAGNOSIS — N63 Unspecified lump in unspecified breast: Secondary | ICD-10-CM | POA: Diagnosis not present

## 2023-02-03 DIAGNOSIS — Z853 Personal history of malignant neoplasm of breast: Secondary | ICD-10-CM | POA: Diagnosis not present

## 2023-02-03 DIAGNOSIS — Z9011 Acquired absence of right breast and nipple: Secondary | ICD-10-CM | POA: Diagnosis not present

## 2023-02-22 ENCOUNTER — Ambulatory Visit: Payer: Self-pay | Admitting: General Surgery

## 2023-02-22 DIAGNOSIS — C50411 Malignant neoplasm of upper-outer quadrant of right female breast: Secondary | ICD-10-CM | POA: Diagnosis not present

## 2023-02-22 DIAGNOSIS — Z170 Estrogen receptor positive status [ER+]: Secondary | ICD-10-CM | POA: Diagnosis not present

## 2023-02-22 DIAGNOSIS — R222 Localized swelling, mass and lump, trunk: Secondary | ICD-10-CM | POA: Diagnosis not present

## 2023-02-23 DIAGNOSIS — D2261 Melanocytic nevi of right upper limb, including shoulder: Secondary | ICD-10-CM | POA: Diagnosis not present

## 2023-02-23 DIAGNOSIS — D485 Neoplasm of uncertain behavior of skin: Secondary | ICD-10-CM | POA: Diagnosis not present

## 2023-02-23 DIAGNOSIS — D225 Melanocytic nevi of trunk: Secondary | ICD-10-CM | POA: Diagnosis not present

## 2023-02-23 DIAGNOSIS — D2262 Melanocytic nevi of left upper limb, including shoulder: Secondary | ICD-10-CM | POA: Diagnosis not present

## 2023-02-23 DIAGNOSIS — L570 Actinic keratosis: Secondary | ICD-10-CM | POA: Diagnosis not present

## 2023-02-23 DIAGNOSIS — X32XXXA Exposure to sunlight, initial encounter: Secondary | ICD-10-CM | POA: Diagnosis not present

## 2023-02-23 DIAGNOSIS — L578 Other skin changes due to chronic exposure to nonionizing radiation: Secondary | ICD-10-CM | POA: Diagnosis not present

## 2023-02-23 DIAGNOSIS — C44519 Basal cell carcinoma of skin of other part of trunk: Secondary | ICD-10-CM | POA: Diagnosis not present

## 2023-02-24 ENCOUNTER — Ambulatory Visit
Admission: RE | Admit: 2023-02-24 | Discharge: 2023-02-24 | Disposition: A | Discharge status: Home / Self Care | Payer: Medicare HMO | Source: Ambulatory Visit | Attending: Family Medicine | Admitting: Family Medicine

## 2023-02-24 DIAGNOSIS — Z853 Personal history of malignant neoplasm of breast: Secondary | ICD-10-CM | POA: Diagnosis not present

## 2023-02-24 DIAGNOSIS — R911 Solitary pulmonary nodule: Secondary | ICD-10-CM | POA: Diagnosis not present

## 2023-03-05 ENCOUNTER — Other Ambulatory Visit: Payer: Self-pay

## 2023-03-05 ENCOUNTER — Encounter (HOSPITAL_BASED_OUTPATIENT_CLINIC_OR_DEPARTMENT_OTHER): Payer: Self-pay | Admitting: General Surgery

## 2023-03-08 DIAGNOSIS — I10 Essential (primary) hypertension: Secondary | ICD-10-CM | POA: Diagnosis not present

## 2023-03-08 DIAGNOSIS — E785 Hyperlipidemia, unspecified: Secondary | ICD-10-CM | POA: Diagnosis not present

## 2023-03-08 NOTE — Progress Notes (Signed)
Patient given presurgical drink. Education provided. Patient verbalized understanding.

## 2023-03-12 ENCOUNTER — Encounter (HOSPITAL_BASED_OUTPATIENT_CLINIC_OR_DEPARTMENT_OTHER): Payer: Self-pay | Admitting: General Surgery

## 2023-03-12 ENCOUNTER — Ambulatory Visit (HOSPITAL_BASED_OUTPATIENT_CLINIC_OR_DEPARTMENT_OTHER): Payer: Medicare HMO | Admitting: Anesthesiology

## 2023-03-12 ENCOUNTER — Other Ambulatory Visit: Payer: Self-pay

## 2023-03-12 ENCOUNTER — Encounter (HOSPITAL_BASED_OUTPATIENT_CLINIC_OR_DEPARTMENT_OTHER)
Admission: RE | Disposition: A | Discharge status: Home / Self Care | Payer: Self-pay | Source: Home / Self Care | Attending: General Surgery

## 2023-03-12 ENCOUNTER — Ambulatory Visit (HOSPITAL_BASED_OUTPATIENT_CLINIC_OR_DEPARTMENT_OTHER)
Admission: RE | Admit: 2023-03-12 | Discharge: 2023-03-12 | Disposition: A | Discharge status: Home / Self Care | Payer: Medicare HMO | Attending: General Surgery | Admitting: General Surgery

## 2023-03-12 DIAGNOSIS — I10 Essential (primary) hypertension: Secondary | ICD-10-CM | POA: Insufficient documentation

## 2023-03-12 DIAGNOSIS — R222 Localized swelling, mass and lump, trunk: Secondary | ICD-10-CM | POA: Diagnosis not present

## 2023-03-12 DIAGNOSIS — C493 Malignant neoplasm of connective and soft tissue of thorax: Secondary | ICD-10-CM | POA: Diagnosis present

## 2023-03-12 DIAGNOSIS — E039 Hypothyroidism, unspecified: Secondary | ICD-10-CM | POA: Insufficient documentation

## 2023-03-12 DIAGNOSIS — Z170 Estrogen receptor positive status [ER+]: Secondary | ICD-10-CM | POA: Diagnosis not present

## 2023-03-12 DIAGNOSIS — Z9011 Acquired absence of right breast and nipple: Secondary | ICD-10-CM | POA: Diagnosis not present

## 2023-03-12 DIAGNOSIS — Z87891 Personal history of nicotine dependence: Secondary | ICD-10-CM | POA: Diagnosis not present

## 2023-03-12 DIAGNOSIS — Z923 Personal history of irradiation: Secondary | ICD-10-CM | POA: Diagnosis not present

## 2023-03-12 DIAGNOSIS — C50411 Malignant neoplasm of upper-outer quadrant of right female breast: Secondary | ICD-10-CM | POA: Diagnosis not present

## 2023-03-12 DIAGNOSIS — Z853 Personal history of malignant neoplasm of breast: Secondary | ICD-10-CM | POA: Insufficient documentation

## 2023-03-12 DIAGNOSIS — Z01818 Encounter for other preprocedural examination: Secondary | ICD-10-CM

## 2023-03-12 HISTORY — PX: BREAST CYST EXCISION: SHX579

## 2023-03-12 SURGERY — EXCISION, CYST, BREAST
Anesthesia: General | Site: Chest | Laterality: Right

## 2023-03-12 MED ORDER — ACETAMINOPHEN 10 MG/ML IV SOLN: 1000.0000 mg | Freq: Once | INTRAVENOUS | Status: DC | PRN

## 2023-03-12 MED ORDER — FENTANYL CITRATE (PF) 100 MCG/2ML IJ SOLN: 25.0000 ug | INTRAMUSCULAR | Status: DC | PRN

## 2023-03-12 MED ORDER — ACETAMINOPHEN 325 MG PO TABS
ORAL_TABLET | ORAL | Status: AC
  Filled 2023-03-12: qty 2

## 2023-03-12 MED ORDER — EPHEDRINE SULFATE (PRESSORS) 50 MG/ML IJ SOLN
INTRAMUSCULAR | Status: DC | PRN
  Administered 2023-03-12 (×3): 5 mg via INTRAVENOUS

## 2023-03-12 MED ORDER — ACETAMINOPHEN 500 MG PO TABS
ORAL_TABLET | ORAL | Status: AC
  Filled 2023-03-12: qty 2

## 2023-03-12 MED ORDER — ACETAMINOPHEN 500 MG PO TABS
1000.0000 mg | ORAL_TABLET | ORAL | Status: AC
  Administered 2023-03-12: 650 mg via ORAL

## 2023-03-12 MED ORDER — EPHEDRINE 5 MG/ML INJ
INTRAVENOUS | Status: AC
  Filled 2023-03-12: qty 5

## 2023-03-12 MED ORDER — MIDAZOLAM HCL 2 MG/2ML IJ SOLN
INTRAMUSCULAR | Status: AC
  Filled 2023-03-12: qty 2

## 2023-03-12 MED ORDER — BUPIVACAINE-EPINEPHRINE 0.25% -1:200000 IJ SOLN
INTRAMUSCULAR | Status: DC | PRN
  Administered 2023-03-12: 12 mL

## 2023-03-12 MED ORDER — DEXAMETHASONE SODIUM PHOSPHATE 10 MG/ML IJ SOLN
INTRAMUSCULAR | Status: DC | PRN
  Administered 2023-03-12: 10 mg via INTRAVENOUS

## 2023-03-12 MED ORDER — ONDANSETRON HCL 4 MG/2ML IJ SOLN
INTRAMUSCULAR | Status: DC | PRN
  Administered 2023-03-12: 4 mg via INTRAVENOUS

## 2023-03-12 MED ORDER — CEFAZOLIN SODIUM-DEXTROSE 2-4 GM/100ML-% IV SOLN
INTRAVENOUS | Status: AC
  Filled 2023-03-12: qty 100

## 2023-03-12 MED ORDER — DROPERIDOL 2.5 MG/ML IJ SOLN: 0.6250 mg | Freq: Once | INTRAMUSCULAR | Status: DC | PRN

## 2023-03-12 MED ORDER — PROPOFOL 10 MG/ML IV BOLUS
INTRAVENOUS | Status: DC | PRN
  Administered 2023-03-12: 200 mg via INTRAVENOUS

## 2023-03-12 MED ORDER — OXYCODONE HCL 5 MG PO TABS: 5.0000 mg | ORAL_TABLET | Freq: Four times a day (QID) | ORAL | 0 refills | Status: DC | PRN

## 2023-03-12 MED ORDER — ACETAMINOPHEN 325 MG PO TABS: 325.0000 mg | ORAL_TABLET | ORAL | Status: DC | PRN

## 2023-03-12 MED ORDER — LIDOCAINE HCL (CARDIAC) PF 100 MG/5ML IV SOSY
PREFILLED_SYRINGE | INTRAVENOUS | Status: DC | PRN
  Administered 2023-03-12: 40 mg via INTRAVENOUS

## 2023-03-12 MED ORDER — CEFAZOLIN SODIUM-DEXTROSE 2-4 GM/100ML-% IV SOLN
2.0000 g | INTRAVENOUS | Status: AC
  Administered 2023-03-12: 2 g via INTRAVENOUS

## 2023-03-12 MED ORDER — FENTANYL CITRATE (PF) 100 MCG/2ML IJ SOLN
INTRAMUSCULAR | Status: DC | PRN
  Administered 2023-03-12: 50 ug via INTRAVENOUS

## 2023-03-12 MED ORDER — GABAPENTIN 100 MG PO CAPS
100.0000 mg | ORAL_CAPSULE | ORAL | Status: AC
  Administered 2023-03-12: 100 mg via ORAL

## 2023-03-12 MED ORDER — FENTANYL CITRATE (PF) 100 MCG/2ML IJ SOLN
INTRAMUSCULAR | Status: AC
  Filled 2023-03-12: qty 2

## 2023-03-12 MED ORDER — MIDAZOLAM HCL 5 MG/5ML IJ SOLN
INTRAMUSCULAR | Status: DC | PRN
  Administered 2023-03-12: 2 mg via INTRAVENOUS

## 2023-03-12 MED ORDER — GABAPENTIN 100 MG PO CAPS
ORAL_CAPSULE | ORAL | Status: AC
  Filled 2023-03-12: qty 1

## 2023-03-12 MED ORDER — ACETAMINOPHEN 160 MG/5ML PO SOLN: 325.0000 mg | ORAL | Status: DC | PRN

## 2023-03-12 MED ORDER — OXYCODONE HCL 5 MG PO TABS
ORAL_TABLET | ORAL | Status: AC
  Filled 2023-03-12: qty 1

## 2023-03-12 MED ORDER — LACTATED RINGERS IV SOLN: INTRAVENOUS | Status: DC

## 2023-03-12 MED ORDER — SCOPOLAMINE 1 MG/3DAYS TD PT72: 1.0000 | MEDICATED_PATCH | TRANSDERMAL | Status: DC

## 2023-03-12 MED ORDER — OXYCODONE HCL 5 MG PO TABS
5.0000 mg | ORAL_TABLET | Freq: Once | ORAL | Status: AC | PRN
  Administered 2023-03-12: 5 mg via ORAL

## 2023-03-12 MED ORDER — OXYCODONE HCL 5 MG/5ML PO SOLN: 5.0000 mg | Freq: Once | ORAL | Status: AC | PRN

## 2023-03-12 MED ORDER — GABAPENTIN 100 MG PO CAPS: 100.0000 mg | ORAL_CAPSULE | Freq: Three times a day (TID) | ORAL | 2 refills | Status: DC

## 2023-03-12 MED ORDER — BUPIVACAINE-EPINEPHRINE (PF) 0.25% -1:200000 IJ SOLN
INTRAMUSCULAR | Status: AC
  Filled 2023-03-12: qty 30

## 2023-03-12 SURGICAL SUPPLY — 46 items
ADH SKN CLS APL DERMABOND .7 (GAUZE/BANDAGES/DRESSINGS) ×1
APL PRP STRL LF DISP 70% ISPRP (MISCELLANEOUS)
BLADE SURG 10 STRL SS (BLADE) ×1 IMPLANT
BLADE SURG 15 STRL LF DISP TIS (BLADE) ×1 IMPLANT
BLADE SURG 15 STRL SS (BLADE) ×1
CANISTER SUCT 1200ML W/VALVE (MISCELLANEOUS) IMPLANT
CHLORAPREP W/TINT 26 (MISCELLANEOUS) ×1 IMPLANT
COVER BACK TABLE 60X90IN (DRAPES) ×1 IMPLANT
COVER MAYO STAND STRL (DRAPES) ×1 IMPLANT
DERMABOND ADVANCED .7 DNX12 (GAUZE/BANDAGES/DRESSINGS) ×1 IMPLANT
DRAPE LAPAROTOMY 100X72 PEDS (DRAPES) ×1 IMPLANT
DRAPE UTILITY XL STRL (DRAPES) ×1 IMPLANT
DURAPREP 26ML APPLICATOR (WOUND CARE) IMPLANT
ELECT COATED BLADE 2.86 ST (ELECTRODE) ×1 IMPLANT
ELECT REM PT RETURN 9FT ADLT (ELECTROSURGICAL) ×1
ELECTRODE REM PT RTRN 9FT ADLT (ELECTROSURGICAL) ×1 IMPLANT
GAUZE 4X4 16PLY ~~LOC~~+RFID DBL (SPONGE) IMPLANT
GLOVE BIO SURGEON STRL SZ7.5 (GLOVE) ×1 IMPLANT
GOWN STRL REUS W/ TWL LRG LVL3 (GOWN DISPOSABLE) ×2 IMPLANT
GOWN STRL REUS W/TWL LRG LVL3 (GOWN DISPOSABLE) ×2
NDL HYPO 25X1 1.5 SAFETY (NEEDLE) IMPLANT
NEEDLE HYPO 25X1 1.5 SAFETY (NEEDLE) ×1
NS IRRIG 1000ML POUR BTL (IV SOLUTION) ×1 IMPLANT
PACK BASIN DAY SURGERY FS (CUSTOM PROCEDURE TRAY) ×1 IMPLANT
PENCIL SMOKE EVACUATOR (MISCELLANEOUS) ×1 IMPLANT
SLEEVE SCD COMPRESS KNEE MED (STOCKING) ×1 IMPLANT
SPIKE FLUID TRANSFER (MISCELLANEOUS) IMPLANT
SPONGE T-LAP 18X18 ~~LOC~~+RFID (SPONGE) ×1 IMPLANT
SUT CHROMIC 3 0 SH 27 (SUTURE) IMPLANT
SUT ETHILON 3 0 PS 1 (SUTURE) IMPLANT
SUT MON AB 4-0 PC3 18 (SUTURE) IMPLANT
SUT PROLENE 3 0 PS 2 (SUTURE) IMPLANT
SUT SILK 2 0 PERMA HAND 18 BK (SUTURE) IMPLANT
SUT VIC AB 3-0 54X BRD REEL (SUTURE) IMPLANT
SUT VIC AB 3-0 BRD 54 (SUTURE)
SUT VIC AB 3-0 FS2 27 (SUTURE) IMPLANT
SUT VIC AB 3-0 SH 27 (SUTURE)
SUT VIC AB 3-0 SH 27X BRD (SUTURE) IMPLANT
SUT VIC AB 4-0 PS2 18 (SUTURE) IMPLANT
SUT VIC AB 4-0 RB1 27 (SUTURE)
SUT VIC AB 4-0 RB1 27X BRD (SUTURE) IMPLANT
SUT VICRYL AB 3 0 TIES (SUTURE) IMPLANT
SYR CONTROL 10ML LL (SYRINGE) IMPLANT
TOWEL GREEN STERILE FF (TOWEL DISPOSABLE) ×2 IMPLANT
TUBE CONNECTING 20X1/4 (TUBING) IMPLANT
YANKAUER SUCT BULB TIP NO VENT (SUCTIONS) IMPLANT

## 2023-03-12 NOTE — Op Note (Signed)
03/12/2023  4:27 PM  PATIENT:  Catherine Robbins  67 y.o. female  PRE-OPERATIVE DIAGNOSIS:  RIGHT CHEST WALL MASS  POST-OPERATIVE DIAGNOSIS:  RIGHT CHEST WALL MASS  PROCEDURE:  Procedure(s): EXCISIONAL BIOPSY RIGHT CHEST WALL MASS (Right)  SURGEON:  Surgeons and Role:    * Griselda Miner, MD - Primary  PHYSICIAN ASSISTANT:   ASSISTANTS: none   ANESTHESIA:   local and general  EBL:  10 mL   BLOOD ADMINISTERED:none  DRAINS: none   LOCAL MEDICATIONS USED:  MARCAINE     SPECIMEN:  Source of Specimen:  right breast mass  DISPOSITION OF SPECIMEN:  PATHOLOGY  COUNTS:  YES  TOURNIQUET:  * No tourniquets in log *  DICTATION: .Dragon Dictation  After informed consent was obtained the patient was brought to the operating room and placed in the supine position on the operating table.  After adequate induction of general anesthesia the patient's right chest was prepped with DuraPrep, allowed to dry, and draped in the usual sterile manner.  An appropriate timeout was performed.  The patient had an ulcerated mass of the right chest wall along the midportion of the mastectomy incision.  The area around this was infiltrated with quarter percent Marcaine.  An elliptical incision was made around the ulcerated mass with a 15 blade knife.  The incision was carried through the skin and subcutaneous tissue sharply with a 15 blade knife.  The dissection was carried all the way to the chest wall and the mass was completely excised.  The mass was marked with a short stitch on the superior surface and a long stitch on the lateral surface and sent to pathology for further evaluation.  Hemostasis was achieved using the Bovie electrocautery.  The wound was then infiltrated with more quarter percent Marcaine.  The incision was then closed with interrupted 4-0 Monocryl subcuticular stitches.  Dermabond dressings were applied.  The patient tolerated the procedure well.  At the end of the case all needle  sponge and instrument counts were correct.  The patient was then awakened and taken to recovery in stable condition.  PLAN OF CARE: Discharge to home after PACU  PATIENT DISPOSITION:  PACU - hemodynamically stable.   Delay start of Pharmacological VTE agent (>24hrs) due to surgical blood loss or risk of bleeding: not applicable

## 2023-03-12 NOTE — Discharge Instructions (Signed)
  Post Anesthesia Home Care Instructions  Activity: Get plenty of rest for the remainder of the day. A responsible individual must stay with you for 24 hours following the procedure.  For the next 24 hours, DO NOT: -Drive a car -Advertising copywriter -Drink alcoholic beverages -Take any medication unless instructed by your physician -Make any legal decisions or sign important papers.  Meals: Start with liquid foods such as gelatin or soup. Progress to regular foods as tolerated. Avoid greasy, spicy, heavy foods. If nausea and/or vomiting occur, drink only clear liquids until the nausea and/or vomiting subsides. Call your physician if vomiting continues.  Special Instructions/Symptoms: Your throat may feel dry or sore from the anesthesia or the breathing tube placed in your throat during surgery. If this causes discomfort, gargle with warm salt water. The discomfort should disappear within 24 hours.  If you had a scopolamine patch placed behind your ear for the management of post- operative nausea and/or vomiting:  1. The medication in the patch is effective for 72 hours, after which it should be removed.  Wrap patch in a tissue and discard in the trash. Wash hands thoroughly with soap and water. 2. You may remove the patch earlier than 72 hours if you experience unpleasant side effects which may include dry mouth, dizziness or visual disturbances. 3. Avoid touching the patch. Wash your hands with soap and water after contact with the patch.    Next dose of tylenol if needed is at 8pm

## 2023-03-12 NOTE — Anesthesia Procedure Notes (Signed)
Procedure Name: LMA Insertion Date/Time: 03/12/2023 4:01 PM  Performed by: Lauralyn Primes, CRNAPre-anesthesia Checklist: Patient identified, Emergency Drugs available, Suction available and Patient being monitored Patient Re-evaluated:Patient Re-evaluated prior to induction Oxygen Delivery Method: Circle system utilized Preoxygenation: Pre-oxygenation with 100% oxygen Induction Type: IV induction Ventilation: Mask ventilation without difficulty LMA: LMA inserted LMA Size: 4.0 Number of attempts: 1 Airway Equipment and Method: Bite block Placement Confirmation: positive ETCO2 Tube secured with: Tape Dental Injury: Teeth and Oropharynx as per pre-operative assessment

## 2023-03-12 NOTE — H&P (Signed)
MRN: [id number] DOB: 09/14/1955 Subjective   Chief Complaint: New Consultation (- masty scar mass,)   History of Present Illness: Catherine Robbins is a 67 y.o. female who is seen today for right breast cancer. The patient is a 67 year old Catherine Robbins female who is 5 years status post right mastectomy and sentinel node biopsy for a T2 N0 right breast cancer that was ER and PR positive and HER2 negative with a Ki-67 of 25%. She also had an Oncotype score of 24. She was treated with radiation and antiestrogens. Over the last month she developed a ulceration along the midportion of her mastectomy incision on the right. She reports some tightness and muscle spasms in that area.    Review of Systems: A complete review of systems was obtained from the patient. I have reviewed this information and discussed as appropriate with the patient. See HPI as well for other ROS.  ROS   Medical History: Past Medical History:  Diagnosis Date  History of cancer  Hyperlipidemia  Hypertension  Thyroid disease   Patient Active Problem List  Diagnosis  Malignant neoplasm of upper-outer quadrant of right breast in female, estrogen receptor positive (CMS/HHS-HCC)  Mass of chest wall, right   Past Surgical History:  Procedure Laterality Date  MASTECTOMY  TONSILLECTOMY    Allergies  Allergen Reactions  Chlorhexidine Hives and Rash  Developed rash over the chest, neck and underarms where was prepped with Chlorhexidine in the OR   Current Outpatient Medications on File Prior to Visit  Medication Sig Dispense Refill  ARMOUR THYROID 60 mg tablet Take 1 tablet by mouth once daily  losartan (COZAAR) 50 MG tablet Take 1 tablet by mouth once daily Orally Once a day for 90 days  rosuvastatin (CRESTOR) 20 MG tablet Take 1 tablet by mouth 3 (three) times a week   No current facility-administered medications on file prior to visit.   Family History  Problem Relation Age of Onset  Coronary Artery Disease (Blocked  arteries around heart) Mother  Diabetes Mother  Stroke Brother  Coronary Artery Disease (Blocked arteries around heart) Brother    Social History   Tobacco Use  Smoking Status Former  Types: Cigarettes  Start date: 1983  Smokeless Tobacco Never    Social History   Socioeconomic History  Marital status: Married  Tobacco Use  Smoking status: Former  Types: Cigarettes  Start date: 1983  Smokeless tobacco: Never  Substance and Sexual Activity  Alcohol use: Yes  Drug use: Never   Objective:   Vitals:  BP: (!) 180/90  Pulse: 82  Temp: 36.7 C (98 F)  SpO2: 99%  Weight: 86.6 kg (191 lb)  Height: 177.8 cm (5\' 10" )   Body mass index is 27.41 kg/m.  Physical Exam Vitals reviewed.  Constitutional:  General: She is not in acute distress. Appearance: Normal appearance.  HENT:  Head: Normocephalic and atraumatic.  Right Ear: External ear normal.  Left Ear: External ear normal.  Nose: Nose normal.  Mouth/Throat:  Mouth: Mucous membranes are moist.  Pharynx: Oropharynx is clear.  Eyes:  General: No scleral icterus. Extraocular Movements: Extraocular movements intact.  Conjunctiva/sclera: Conjunctivae normal.  Pupils: Pupils are equal, round, and reactive to light.  Cardiovascular:  Rate and Rhythm: Normal rate and regular rhythm.  Pulses: Normal pulses.  Heart sounds: Normal heart sounds.  Pulmonary:  Effort: Pulmonary effort is normal. No respiratory distress.  Breath sounds: Normal breath sounds.  Abdominal:  General: Bowel sounds are normal.  Palpations: Abdomen is soft.  Tenderness: There is no abdominal tenderness.  Musculoskeletal:  General: No swelling, tenderness or deformity. Normal range of motion.  Cervical back: Normal range of motion and neck supple.  Skin: General: Skin is warm and dry.  Coloration: Skin is not jaundiced.  Neurological:  General: No focal deficit present.  Mental Status: She is alert and oriented to person, place, and time.   Psychiatric:  Mood and Affect: Mood normal.  Behavior: Behavior normal.     Breast: The right mastectomy incision has healed well with no sign of infection or seroma. The skin flaps are healthy. There is a 1 cm ulcerated area along the midportion of the mastectomy scar that is new  Labs, Imaging and Diagnostic Testing:  Assessment and Plan:   Diagnoses and all orders for this visit:  Malignant neoplasm of upper-outer quadrant of right breast in female, estrogen receptor positive (CMS/HHS-HCC)  Mass of chest wall, right - [facility] Case Posting Request; Future    The patient appears to have a new ulceration along the midportion of her mastectomy scar on the right. Given her history of breast cancer I feel this should be excisionally biopsied to know exactly why the ulcer formed. I have discussed with her in detail the risks and benefits of the operation as well as some of the technical aspects and she understands and wishes to proceed.

## 2023-03-12 NOTE — Anesthesia Postprocedure Evaluation (Signed)
Anesthesia Post Note  Patient: Catherine Robbins  Procedure(s) Performed: EXCISIONAL BIOPSY RIGHT CHEST WALL MASS (Right: Chest)     Patient location during evaluation: PACU Anesthesia Type: General Level of consciousness: awake and alert Pain management: pain level controlled Vital Signs Assessment: post-procedure vital signs reviewed and stable Respiratory status: spontaneous breathing, nonlabored ventilation, respiratory function stable and patient connected to nasal cannula oxygen Cardiovascular status: blood pressure returned to baseline and stable Postop Assessment: no apparent nausea or vomiting Anesthetic complications: no  No notable events documented.  Last Vitals:  Vitals:   03/12/23 1647 03/12/23 1711  BP: (!) 168/90 (!) 160/87  Pulse: 72 71  Resp: 15 18  Temp:  (!) 36.2 C  SpO2: 100% 99%    Last Pain:  Vitals:   03/12/23 1711  TempSrc: Temporal  PainSc:                  Shelton Silvas

## 2023-03-12 NOTE — Anesthesia Preprocedure Evaluation (Signed)
Anesthesia Evaluation  Patient identified by MRN, date of birth, ID band Patient awake    Reviewed: Allergy & Precautions, NPO status , Patient's Chart, lab work & pertinent test results  Airway Mallampati: II  TM Distance: >3 FB Neck ROM: Full    Dental  (+) Teeth Intact, Dental Advisory Given   Pulmonary former smoker   breath sounds clear to auscultation       Cardiovascular hypertension, Pt. on medications  Rhythm:Regular Rate:Normal     Neuro/Psych negative neurological ROS  negative psych ROS   GI/Hepatic negative GI ROS, Neg liver ROS,,,  Endo/Other  Hypothyroidism    Renal/GU negative Renal ROS     Musculoskeletal negative musculoskeletal ROS (+)    Abdominal   Peds  Hematology negative hematology ROS (+)   Anesthesia Other Findings   Reproductive/Obstetrics                             Anesthesia Physical Anesthesia Plan  ASA: 2  Anesthesia Plan: General   Post-op Pain Management: Tylenol PO (pre-op)*, Toradol IV (intra-op)* and Gabapentin PO (pre-op)*   Induction: Intravenous  PONV Risk Score and Plan: 4 or greater and Ondansetron, Dexamethasone, Scopolamine patch - Pre-op and Midazolam  Airway Management Planned: LMA and Oral ETT  Additional Equipment: None  Intra-op Plan:   Post-operative Plan: Extubation in OR  Informed Consent: I have reviewed the patients History and Physical, chart, labs and discussed the procedure including the risks, benefits and alternatives for the proposed anesthesia with the patient or authorized representative who has indicated his/her understanding and acceptance.     Dental advisory given  Plan Discussed with: CRNA  Anesthesia Plan Comments:        Anesthesia Quick Evaluation

## 2023-03-12 NOTE — Interval H&P Note (Signed)
History and Physical Interval Note:  03/12/2023 3:26 PM  Catherine Robbins  has presented today for surgery, with the diagnosis of RIGHT CHEST WALL MASS.  The various methods of treatment have been discussed with the patient and family. After consideration of risks, benefits and other options for treatment, the patient has consented to  Procedure(s): EXCISIONAL BIOPSY RIGHT CHEST WALL MASS (Right) as a surgical intervention.  The patient's history has been reviewed, patient examined, no change in status, stable for surgery.  I have reviewed the patient's chart and labs.  Questions were answered to the patient's satisfaction.     Chevis Pretty III

## 2023-03-12 NOTE — Transfer of Care (Signed)
Immediate Anesthesia Transfer of Care Note  Patient: Catherine Robbins  Procedure(s) Performed: EXCISIONAL BIOPSY RIGHT CHEST WALL MASS (Right: Chest)  Patient Location: PACU  Anesthesia Type:General  Level of Consciousness: awake, alert , and oriented  Airway & Oxygen Therapy: Patient Spontanous Breathing and Patient connected to face mask oxygen  Post-op Assessment: Report given to RN and Post -op Vital signs reviewed and stable  Post vital signs: Reviewed and stable  Last Vitals:  Vitals Value Taken Time  BP 156/89 03/12/23 1637  Temp    Pulse 74 03/12/23 1639  Resp 12   SpO2 100 % 03/12/23 1639  Vitals shown include unfiled device data.  Last Pain:  Vitals:   03/12/23 1348  TempSrc: Temporal  PainSc: 4       Patients Stated Pain Goal: 4 (03/12/23 1348)  Complications: No notable events documented.

## 2023-03-15 ENCOUNTER — Encounter (HOSPITAL_BASED_OUTPATIENT_CLINIC_OR_DEPARTMENT_OTHER): Payer: Self-pay | Admitting: General Surgery

## 2023-03-15 DIAGNOSIS — R69 Illness, unspecified: Secondary | ICD-10-CM | POA: Diagnosis not present

## 2023-03-18 LAB — SURGICAL PATHOLOGY

## 2023-03-23 DIAGNOSIS — L570 Actinic keratosis: Secondary | ICD-10-CM | POA: Diagnosis not present

## 2023-03-23 DIAGNOSIS — D235 Other benign neoplasm of skin of trunk: Secondary | ICD-10-CM | POA: Diagnosis not present

## 2023-03-23 DIAGNOSIS — X32XXXA Exposure to sunlight, initial encounter: Secondary | ICD-10-CM | POA: Diagnosis not present

## 2023-03-23 DIAGNOSIS — C44519 Basal cell carcinoma of skin of other part of trunk: Secondary | ICD-10-CM | POA: Diagnosis not present

## 2023-03-31 NOTE — Progress Notes (Signed)
New Breast Cancer Diagnosis: Recurrent Right Breast Cancer.  Patient is 5 years status post right mastectomy and sentinel node biopsy for a T2 N0 right breast cancer that was ER and PR positive and HER2 negative with a Ki-67 of 25%. She also had an Oncotype score of 24. She was treated with radiation and antiestrogens. Over the last month she developed a ulceration along the midportion of her mastectomy incision on the right. She reports some tightness and muscle spasms in that area.     Histology per Pathology Report: 03/12/2023  Receptor Status: ER(positive), PR (positive), Her2-neu (negative), Ki-(***%)   Surgeon and surgical plan, if any:  Dr. Carolynne Edouard -Excisional Biopsy-Right chest wall mass 03/12/2023   Medical oncologist, treatment if any:   Dr. Pamelia Hoit 04/07/2023   Family History of Breast/Ovarian/Prostate Cancer:   Lymphedema issues, if any:      Pain issues, if any:     SAFETY ISSUES: Prior radiation? Right Breast, 35 fractions, March 2006. Pacemaker/ICD?  Possible current pregnancy? Is the patient on methotrexate?   Current Complaints / other details:   Genetics 09/06/2017, Negative genetic testing

## 2023-04-01 ENCOUNTER — Ambulatory Visit
Admission: RE | Admit: 2023-04-01 | Discharge: 2023-04-01 | Disposition: A | Discharge status: Home / Self Care | Payer: Medicare HMO | Source: Ambulatory Visit | Attending: Radiation Oncology | Admitting: Radiation Oncology

## 2023-04-01 ENCOUNTER — Encounter: Payer: Self-pay | Admitting: Radiation Oncology

## 2023-04-01 ENCOUNTER — Other Ambulatory Visit: Payer: Self-pay

## 2023-04-01 VITALS — Ht 70.0 in | Wt 180.0 lb

## 2023-04-01 DIAGNOSIS — Z170 Estrogen receptor positive status [ER+]: Secondary | ICD-10-CM | POA: Diagnosis not present

## 2023-04-01 DIAGNOSIS — C50411 Malignant neoplasm of upper-outer quadrant of right female breast: Secondary | ICD-10-CM

## 2023-04-06 DIAGNOSIS — D692 Other nonthrombocytopenic purpura: Secondary | ICD-10-CM | POA: Diagnosis not present

## 2023-04-07 ENCOUNTER — Inpatient Hospital Stay: Payer: Medicare HMO | Attending: Hematology and Oncology | Admitting: Hematology and Oncology

## 2023-04-07 ENCOUNTER — Inpatient Hospital Stay: Payer: Medicare HMO

## 2023-04-07 ENCOUNTER — Other Ambulatory Visit: Payer: Self-pay | Admitting: *Deleted

## 2023-04-07 VITALS — BP 139/64 | HR 87 | Temp 97.8°F | Resp 18 | Ht 70.0 in | Wt 191.1 lb

## 2023-04-07 DIAGNOSIS — Z79811 Long term (current) use of aromatase inhibitors: Secondary | ICD-10-CM | POA: Diagnosis not present

## 2023-04-07 DIAGNOSIS — Z170 Estrogen receptor positive status [ER+]: Secondary | ICD-10-CM

## 2023-04-07 DIAGNOSIS — Z923 Personal history of irradiation: Secondary | ICD-10-CM | POA: Insufficient documentation

## 2023-04-07 DIAGNOSIS — Z1721 Progesterone receptor positive status: Secondary | ICD-10-CM | POA: Diagnosis not present

## 2023-04-07 DIAGNOSIS — D6800 Von Willebrand disease, unspecified: Secondary | ICD-10-CM | POA: Insufficient documentation

## 2023-04-07 DIAGNOSIS — G8929 Other chronic pain: Secondary | ICD-10-CM | POA: Diagnosis not present

## 2023-04-07 DIAGNOSIS — Z79899 Other long term (current) drug therapy: Secondary | ICD-10-CM | POA: Diagnosis not present

## 2023-04-07 DIAGNOSIS — L98499 Non-pressure chronic ulcer of skin of other sites with unspecified severity: Secondary | ICD-10-CM | POA: Insufficient documentation

## 2023-04-07 DIAGNOSIS — Z888 Allergy status to other drugs, medicaments and biological substances status: Secondary | ICD-10-CM | POA: Diagnosis not present

## 2023-04-07 DIAGNOSIS — C50411 Malignant neoplasm of upper-outer quadrant of right female breast: Secondary | ICD-10-CM

## 2023-04-07 LAB — CBC WITH DIFFERENTIAL (CANCER CENTER ONLY)
Abs Immature Granulocytes: 0.02 10*3/uL (ref 0.00–0.07)
Basophils Absolute: 0 10*3/uL (ref 0.0–0.1)
Basophils Relative: 1 %
Eosinophils Absolute: 0.1 10*3/uL (ref 0.0–0.5)
Eosinophils Relative: 1 %
HCT: 41.2 % (ref 36.0–46.0)
Hemoglobin: 13.9 g/dL (ref 12.0–15.0)
Immature Granulocytes: 0 %
Lymphocytes Relative: 19 %
Lymphs Abs: 1.4 10*3/uL (ref 0.7–4.0)
MCH: 30.5 pg (ref 26.0–34.0)
MCHC: 33.7 g/dL (ref 30.0–36.0)
MCV: 90.5 fL (ref 80.0–100.0)
Monocytes Absolute: 0.5 10*3/uL (ref 0.1–1.0)
Monocytes Relative: 7 %
Neutro Abs: 5.3 10*3/uL (ref 1.7–7.7)
Neutrophils Relative %: 72 %
Platelet Count: 227 10*3/uL (ref 150–400)
RBC: 4.55 MIL/uL (ref 3.87–5.11)
RDW: 12.9 % (ref 11.5–15.5)
WBC Count: 7.3 10*3/uL (ref 4.0–10.5)
nRBC: 0 % (ref 0.0–0.2)

## 2023-04-07 LAB — CMP (CANCER CENTER ONLY)
ALT: 26 U/L (ref 0–44)
AST: 30 U/L (ref 15–41)
Albumin: 5.1 g/dL — ABNORMAL HIGH (ref 3.5–5.0)
Alkaline Phosphatase: 56 U/L (ref 38–126)
Anion gap: 8 (ref 5–15)
BUN: 16 mg/dL (ref 8–23)
CO2: 27 mmol/L (ref 22–32)
Calcium: 9.7 mg/dL (ref 8.9–10.3)
Chloride: 97 mmol/L — ABNORMAL LOW (ref 98–111)
Creatinine: 0.7 mg/dL (ref 0.44–1.00)
GFR, Estimated: >60 mL/min (ref >60)
Glucose, Bld: 105 mg/dL — ABNORMAL HIGH (ref 70–99)
Potassium: 4.5 mmol/L (ref 3.5–5.1)
Sodium: 132 mmol/L — ABNORMAL LOW (ref 135–145)
Total Bilirubin: 0.7 mg/dL (ref <1.2)
Total Protein: 7.5 g/dL (ref 6.5–8.1)

## 2023-04-07 MED ORDER — GABAPENTIN 300 MG PO CAPS: 300.0000 mg | ORAL_CAPSULE | Freq: Three times a day (TID) | ORAL | 3 refills | Status: DC

## 2023-04-07 MED ORDER — ANASTROZOLE 1 MG PO TABS: 1.0000 mg | ORAL_TABLET | Freq: Every day | ORAL | 3 refills | Status: DC

## 2023-04-07 NOTE — Assessment & Plan Note (Addendum)
07/30/2004: Right lumpectomy and sentinel lymph node biopsy T1c N0 stage Ia grade 1 IDC ER/PR positive HER2 negative Ki-67 7% status post radiation, intolerant of tamoxifen, exemestane x 5 years 08/17/2017: Recurrence: Grade 2 IDC ER/PR positive HER2 negative Ki-67 25%, secondary of concern 0.4 cm 09/27/2017: Right mastectomy: Multifocal IDC grade 2, 2.4 cm and 0.5 cm, margins negative, 0/2 lymph nodes, T2 N0, reconstruction with implant 09/08/2017: Genetic testing: Negative History of von Willebrand disease and DES intrauterine exposure 03/12/2023: Soft tissue mass right chest wall: Involved by breast cancer involving epidermis with ulceration, margins negative, ER 99%, PR 100%, HER2 0 02/24/2023: CT chest: No evidence of metastatic disease  Treatment plan: Adjuvant antiestrogen therapy indefinitely: We discussed on the role of antiestrogen therapy with CDK 4 6 inhibitors

## 2023-04-08 ENCOUNTER — Encounter: Payer: Self-pay | Admitting: *Deleted

## 2023-04-08 NOTE — Progress Notes (Signed)
Patient Care Team: Merri Brunette, MD as PCP - General (Family Medicine) Wendall Stade, MD as PCP - Cardiology (Cardiology) Griselda Miner, MD as Consulting Physician (General Surgery) Dorothy Puffer, MD as Consulting Physician (Radiation Oncology) Mack Hook, MD as Consulting Physician (Orthopedic Surgery) Philip Aspen, DO as Consulting Physician (Obstetrics and Gynecology) Rachel Moulds, MD as Consulting Physician (Hematology and Oncology)  DIAGNOSIS:  Encounter Diagnosis  Name Primary?   Malignant neoplasm of upper-outer quadrant of right breast in female, estrogen receptor positive (HCC) Yes    SUMMARY OF ONCOLOGIC HISTORY: Oncology History  Malignant neoplasm of upper-outer quadrant of right breast in female, estrogen receptor positive (HCC)  08/20/2017 Initial Diagnosis   Malignant neoplasm of upper-outer quadrant of right breast in female, estrogen receptor positive (HCC)   09/06/2017 Genetic Testing   Negative genetic testing on the 9 gene STAT panel and the common hereditary cancer panel.  The Hereditary Gene Panel offered by [facility] includes sequencing and/or deletion duplication testing of the following 47 genes: APC, ATM, AXIN2, BARD1, BMPR1A, BRCA1, BRCA2, BRIP1, CDH1, CDK4, CDKN2A (p14ARF), CDKN2A (p16INK4a), CHEK2, CTNNA1, DICER1, EPCAM (Deletion/duplication testing only), GREM1 (promoter region deletion/duplication testing only), KIT, MEN1, MLH1, MSH2, MSH3, MSH6, MUTYH, NBN, NF1, NHTL1, PALB2, PDGFRA, PMS2, POLD1, POLE, PTEN, RAD50, RAD51C, RAD51D, SDHB, SDHC, SDHD, SMAD4, SMARCA4. STK11, TP53, TSC1, TSC2, and VHL.  The following genes were evaluated for sequence changes only: SDHA and HOXB13 c.251G>A variant only.   The report date is September 07, 2017.    10/13/2017 Cancer Staging   Staging form: Breast, AJCC 8th Edition - Pathologic: Stage IA (pT2, pN0, cM0, G2, ER+, PR+, HER2-) - Signed by Loa Socks, NP on 10/13/2017   03/12/2023  Relapse/Recurrence   Soft tissue mass right chest wall: Involved by breast cancer involving epidermis with ulceration, margins negative, ER 99%, PR 100%, HER2 0      CHIEF COMPLIANT:   HISTORY OF PRESENT ILLNESS:  History of Present Illness   The patient, with a history of breast cancer, presents with chronic pain in the area of previous surgeries, including lumpectomy, radiation, mastectomy, and failed reconstruction. The pain, described as neurological, has increased in intensity over the past few weeks and is significantly impacting the patient's quality of life. The patient reports that wearing a bra exacerbates the pain, which has been severe enough to confine her to bed. The patient also reports a skin ulceration that occurred a couple of months ago, which was surgically removed. The patient has been off hormone therapy for about six to eight months and is concerned about the potential for recurrence of the cancer.         ALLERGIES:  is allergic to bupropion, chlorhexidine gluconate  [chlorhexidine], and tamoxifen.  MEDICATIONS:  Current Outpatient Medications  Medication Sig Dispense Refill   anastrozole (ARIMIDEX) 1 MG tablet Take 1 tablet (1 mg total) by mouth daily. 90 tablet 3   gabapentin (NEURONTIN) 300 MG capsule Take 1 capsule (300 mg total) by mouth 3 (three) times daily. 270 capsule 3   Ascorbic Acid (VITAMIN C PO) Take by mouth.     Coenzyme Q10 (CO Q 10) 100 MG CAPS Take 100 mg by mouth daily.     losartan (COZAAR) 50 MG tablet Take 50 mg by mouth daily.     Menaquinone-7 (VITAMIN K2) 100 MCG CAPS Take 100 mg by mouth daily.     Multiple Vitamins-Minerals (MULTIVITAMIN ADULT PO) Take by mouth.     NP THYROID 60  MG tablet Take 60 mg by mouth daily before breakfast.      rosuvastatin (CRESTOR) 20 MG tablet Take 20 mg by mouth 2 (two) times a week.     VITAMIN D PO Take by mouth.     Zinc Acetate, Oral, (ZINC ACETATE PO) Take by mouth.     No current  facility-administered medications for this visit.    PHYSICAL EXAMINATION: ECOG PERFORMANCE STATUS: 1 - Symptomatic but completely ambulatory  Vitals:   04/07/23 1525  BP: 139/64  Pulse: 87  Resp: 18  Temp: 97.8 F (36.6 C)  SpO2: 95%   Filed Weights   04/07/23 1525  Weight: 191 lb 1.6 oz (86.7 kg)    Physical Exam          (exam performed in the presence of a chaperone)  LABORATORY DATA:  I have reviewed the data as listed    Latest Ref Rng & Units 04/07/2023    4:37 PM 05/18/2022   11:33 AM 05/08/2021    1:52 PM  CMP  Glucose 70 - 99 mg/dL 725  366  440   BUN 8 - 23 mg/dL 16  24  15    Creatinine 0.44 - 1.00 mg/dL 3.47  4.25  9.56   Sodium 135 - 145 mmol/L 132  139  139   Potassium 3.5 - 5.1 mmol/L 4.5  3.9  3.9   Chloride 98 - 111 mmol/L 97  105  103   CO2 22 - 32 mmol/L 27  26  23    Calcium 8.9 - 10.3 mg/dL 9.7  38.7  9.5   Total Protein 6.5 - 8.1 g/dL 7.5  7.1  7.7   Total Bilirubin <1.2 mg/dL 0.7  0.6  0.5   Alkaline Phos 38 - 126 U/L 56  75  71   AST 15 - 41 U/L 30  28  31    ALT 0 - 44 U/L 26  19  23      Lab Results  Component Value Date   WBC 7.3 04/07/2023   HGB 13.9 04/07/2023   HCT 41.2 04/07/2023   MCV 90.5 04/07/2023   PLT 227 04/07/2023   NEUTROABS 5.3 04/07/2023    ASSESSMENT & PLAN:  Malignant neoplasm of upper-outer quadrant of right breast in female, estrogen receptor positive (HCC) 07/30/2004: Right lumpectomy and sentinel lymph node biopsy T1c N0 stage Ia grade 1 IDC ER/PR positive HER2 negative Ki-67 7% status post radiation, intolerant of tamoxifen, exemestane x 5 years 08/17/2017: Recurrence: Grade 2 IDC ER/PR positive HER2 negative Ki-67 25%, secondary of concern 0.4 cm 09/27/2017: Right mastectomy: Multifocal IDC grade 2, 2.4 cm and 0.5 cm, margins negative, 0/2 lymph nodes, T2 N0, reconstruction with implant 09/08/2017: Genetic testing: Negative History of von Willebrand disease and DES intrauterine exposure 03/12/2023: Soft tissue  mass right chest wall: Involved by breast cancer involving epidermis with ulceration, margins negative, ER 99%, PR 100%, HER2 0 02/24/2023: CT chest: No evidence of metastatic disease  Treatment plan: Adjuvant antiestrogen therapy indefinitely: We discussed on the role of antiestrogen therapy with CDK 4 6 inhibitors ------------------------------------- Assessment and Plan    Recurrent Breast Cancer   Recurrence of breast cancer after lumpectomy, radiation, and mastectomy. The patient had a recent surgery to remove a skin lesion which was completely excised. The patient has been off anastrozole for about 6-8 months prior to the recurrence.   - Resume anastrozole lifelong.   - Order a chest wall ultrasound to ensure the rest of the  chest wall is clear.   - Consider Gardant Reveal blood test every six months for early detection of recurrence.    Chronic Pain   Chronic pain in the area of previous surgeries and radiation. The patient is currently on gabapentin 100mg  three times a day with minimal relief.   - Increase gabapentin to 300mg  three times a day, starting with 300mg  twice a day and titrating up as tolerated.    Genetic Testing   The patient had genetic testing in 2006 and has two daughters. She is concerned about her risk.   - Refer to genetic counseling for updated genetic testing.    Follow-up in 6 months to assess response to anastrozole and manage any side effects.          Orders Placed This Encounter  Procedures   [place] LIMITED ULTRASOUND INCLUDING AXILLA LEFT BREAST     Standing Status:   Future    Standing Expiration Date:   04/06/2024    Order Specific Question:   Reason for Exam (SYMPTOM  OR DIAGNOSIS REQUIRED)    Answer:   chest wall ultrasound    Comments:   [facility]    Order Specific Question:   Preferred imaging location?    Answer:   External    Order Specific Question:   Release to patient    Answer:   Immediate   The patient has a good understanding of the  overall plan. she agrees with it. she will call with any problems that may develop before the next visit here. Total time spent: 30 mins including face to face time and time spent for planning, charting and co-ordination of care   Tamsen Meek, MD 04/08/23

## 2023-04-16 ENCOUNTER — Encounter: Payer: Self-pay | Admitting: *Deleted

## 2023-04-16 ENCOUNTER — Other Ambulatory Visit: Payer: Self-pay | Admitting: *Deleted

## 2023-04-16 DIAGNOSIS — C50411 Malignant neoplasm of upper-outer quadrant of right female breast: Secondary | ICD-10-CM

## 2023-04-16 NOTE — Progress Notes (Signed)
RN successfully faxed [facility] order for left chest wall [place].

## 2023-04-16 NOTE — Progress Notes (Signed)
Per MD request RN successfully faxed Guardant Reveal orders.

## 2023-04-19 DIAGNOSIS — Z1239 Encounter for other screening for malignant neoplasm of breast: Secondary | ICD-10-CM | POA: Diagnosis not present

## 2023-04-21 ENCOUNTER — Encounter: Payer: Self-pay | Admitting: Hematology and Oncology

## 2023-05-03 DIAGNOSIS — H5213 Myopia, bilateral: Secondary | ICD-10-CM | POA: Diagnosis not present

## 2023-05-11 ENCOUNTER — Encounter: Payer: Self-pay | Admitting: Hematology and Oncology

## 2023-05-17 ENCOUNTER — Telehealth: Payer: Self-pay | Admitting: Genetic Counselor

## 2023-05-17 ENCOUNTER — Encounter: Payer: Self-pay | Admitting: Genetic Counselor

## 2023-05-17 NOTE — Telephone Encounter (Signed)
Called patient to review negative genetic testing from 2019.  Discussed that she was tested for all known breast cancer genes (in addition to other cancer genes-- 47 gene panel).  Discussed genetic testing for cancer risks is not indicated for her daughters based on this negative result, but it is important that relatives inform their providers about the family history of cancer and remain diligent about breast imaging at age 67 (or 10 years before youngest diagnosis of breast cancer in the family).  Contact information provided in case questions arise.   Patient said she is okay canceling genetics appt for tomorrow.  Copy of test report from 2019 mailed to patient.

## 2023-05-18 ENCOUNTER — Inpatient Hospital Stay: Payer: Medicare HMO | Admitting: Genetic Counselor

## 2023-05-18 ENCOUNTER — Inpatient Hospital Stay: Payer: Medicare HMO

## 2023-09-21 DIAGNOSIS — D2261 Melanocytic nevi of right upper limb, including shoulder: Secondary | ICD-10-CM | POA: Diagnosis not present

## 2023-09-21 DIAGNOSIS — D0439 Carcinoma in situ of skin of other parts of face: Secondary | ICD-10-CM | POA: Diagnosis not present

## 2023-09-21 DIAGNOSIS — C4401 Basal cell carcinoma of skin of lip: Secondary | ICD-10-CM | POA: Diagnosis not present

## 2023-09-21 DIAGNOSIS — Z85828 Personal history of other malignant neoplasm of skin: Secondary | ICD-10-CM | POA: Diagnosis not present

## 2023-09-21 DIAGNOSIS — D485 Neoplasm of uncertain behavior of skin: Secondary | ICD-10-CM | POA: Diagnosis not present

## 2023-09-21 DIAGNOSIS — D2262 Melanocytic nevi of left upper limb, including shoulder: Secondary | ICD-10-CM | POA: Diagnosis not present

## 2023-09-21 DIAGNOSIS — D2272 Melanocytic nevi of left lower limb, including hip: Secondary | ICD-10-CM | POA: Diagnosis not present

## 2023-09-21 DIAGNOSIS — D225 Melanocytic nevi of trunk: Secondary | ICD-10-CM | POA: Diagnosis not present

## 2023-10-01 ENCOUNTER — Telehealth: Payer: Self-pay

## 2023-10-01 NOTE — Telephone Encounter (Signed)
 MD ordered Guardant Reveal on pt. Patient declined.

## 2023-10-05 ENCOUNTER — Inpatient Hospital Stay: Payer: Medicare HMO | Attending: Hematology and Oncology | Admitting: Hematology and Oncology

## 2023-10-05 VITALS — BP 148/72 | HR 75 | Temp 97.7°F | Resp 18 | Ht 70.0 in | Wt 174.4 lb

## 2023-10-05 DIAGNOSIS — R079 Chest pain, unspecified: Secondary | ICD-10-CM | POA: Diagnosis not present

## 2023-10-05 DIAGNOSIS — Z79811 Long term (current) use of aromatase inhibitors: Secondary | ICD-10-CM | POA: Insufficient documentation

## 2023-10-05 DIAGNOSIS — Z170 Estrogen receptor positive status [ER+]: Secondary | ICD-10-CM | POA: Insufficient documentation

## 2023-10-05 DIAGNOSIS — C50411 Malignant neoplasm of upper-outer quadrant of right female breast: Secondary | ICD-10-CM | POA: Diagnosis not present

## 2023-10-05 DIAGNOSIS — D6800 Von Willebrand disease, unspecified: Secondary | ICD-10-CM | POA: Insufficient documentation

## 2023-10-05 DIAGNOSIS — Z79899 Other long term (current) drug therapy: Secondary | ICD-10-CM | POA: Insufficient documentation

## 2023-10-05 DIAGNOSIS — Z923 Personal history of irradiation: Secondary | ICD-10-CM | POA: Diagnosis not present

## 2023-10-05 DIAGNOSIS — Z888 Allergy status to other drugs, medicaments and biological substances status: Secondary | ICD-10-CM | POA: Diagnosis not present

## 2023-10-05 DIAGNOSIS — G8929 Other chronic pain: Secondary | ICD-10-CM | POA: Insufficient documentation

## 2023-10-05 DIAGNOSIS — Z1721 Progesterone receptor positive status: Secondary | ICD-10-CM | POA: Insufficient documentation

## 2023-10-05 DIAGNOSIS — Z1732 Human epidermal growth factor receptor 2 negative status: Secondary | ICD-10-CM | POA: Insufficient documentation

## 2023-10-05 DIAGNOSIS — R634 Abnormal weight loss: Secondary | ICD-10-CM | POA: Insufficient documentation

## 2023-10-05 NOTE — Assessment & Plan Note (Signed)
 07/30/2004: Right lumpectomy and sentinel lymph node biopsy T1c N0 stage Ia grade 1 IDC ER/PR positive HER2 negative Ki-67 7% status post radiation, intolerant of tamoxifen, exemestane x 5 years 08/17/2017: Recurrence: Grade 2 IDC ER/PR positive HER2 negative Ki-67 25%, secondary of concern 0.4 cm 09/27/2017: Right mastectomy: Multifocal IDC grade 2, 2.4 cm and 0.5 cm, margins negative, 0/2 lymph nodes, T2 N0, reconstruction with implant 09/08/2017: Genetic testing: Negative History of von Willebrand disease and DES intrauterine exposure 03/12/2023: Soft tissue mass right chest wall: Involved by breast cancer involving epidermis with ulceration, margins negative, ER 99%, PR 100%, HER2 0 02/24/2023: CT chest: No evidence of metastatic disease 04/09/2023: U/s chest: benign   Treatment plan: Adjuvant antiestrogen therapy indefinitely: We discussed on the role of antiestrogen therapy with CDK 4/6 inhibitors Guardant reveal  Chronic Pain   Chronic pain in the area of previous surgeries and radiation. Increased gabapentin  to 300mg  three times a day.

## 2023-10-05 NOTE — Progress Notes (Signed)
 Patient Care Team: Faustina Hood, MD as PCP - General (Family Medicine) Loyde Rule, MD as PCP - Cardiology (Cardiology) Caralyn Chandler, MD as Consulting Physician (General Surgery) Johna Myers, MD as Consulting Physician (Radiation Oncology) Rober Chimera, MD as Consulting Physician (Orthopedic Surgery) Atlas Blank, DO as Consulting Physician (Obstetrics and Gynecology) Murleen Arms, MD as Consulting Physician (Hematology and Oncology)  DIAGNOSIS:  Encounter Diagnosis  Name Primary?   Malignant neoplasm of upper-outer quadrant of right breast in female, estrogen receptor positive (HCC) Yes    SUMMARY OF ONCOLOGIC HISTORY: Oncology History  Malignant neoplasm of upper-outer quadrant of right breast in female, estrogen receptor positive (HCC)  08/20/2017 Initial Diagnosis   Malignant neoplasm of upper-outer quadrant of right breast in female, estrogen receptor positive (HCC)   09/06/2017 Genetic Testing   Negative genetic testing on the 9 gene STAT panel and the common hereditary cancer panel.  The Hereditary Gene Panel offered by [facility] includes sequencing and/or deletion duplication testing of the following 47 genes: APC, ATM, AXIN2, BARD1, BMPR1A, BRCA1, BRCA2, BRIP1, CDH1, CDK4, CDKN2A (p14ARF), CDKN2A (p16INK4a), CHEK2, CTNNA1, DICER1, EPCAM (Deletion/duplication testing only), GREM1 (promoter region deletion/duplication testing only), KIT, MEN1, MLH1, MSH2, MSH3, MSH6, MUTYH, NBN, NF1, NHTL1, PALB2, PDGFRA, PMS2, POLD1, POLE, PTEN, RAD50, RAD51C, RAD51D, SDHB, SDHC, SDHD, SMAD4, SMARCA4. STK11, TP53, TSC1, TSC2, and VHL.  The following genes were evaluated for sequence changes only: SDHA and HOXB13 c.251G>A variant only.   The report date is September 07, 2017.    10/13/2017 Cancer Staging   Staging form: Breast, AJCC 8th Edition - Pathologic: Stage IA (pT2, pN0, cM0, G2, ER+, PR+, HER2-) - Signed by Percival Brace, NP on 10/13/2017   03/12/2023  Relapse/Recurrence   Soft tissue mass right chest wall: Involved by breast cancer involving epidermis with ulceration, margins negative, ER 99%, PR 100%, HER2 0      CHIEF COMPLIANT: Surveillance of breast cancer, improvement in chest wall pain  HISTORY OF PRESENT ILLNESS:   History of Present Illness Catherine Robbins is a 68 year old female who presents with ongoing chest pain and intolerance to pressure from wearing a bra.  Gabapentin  provides significant relief from chest pain, which previously limited her activities for three months. She resumed her baseline activity level within three weeks of starting the medication. She takes gabapentin  regularly, with pain returning if she misses her 2 PM dose. No side effects are reported.  Intolerance to bra pressure persists, leading her to remove it when possible.  She has lost weight and maintains a healthy diet.     ALLERGIES:  is allergic to bupropion, chlorhexidine  gluconate  [chlorhexidine ], and tamoxifen.  MEDICATIONS:  Current Outpatient Medications  Medication Sig Dispense Refill   anastrozole  (ARIMIDEX ) 1 MG tablet Take 1 tablet (1 mg total) by mouth daily. 90 tablet 3   Ascorbic Acid (VITAMIN C PO) Take by mouth.     Coenzyme Q10 (CO Q 10) 100 MG CAPS Take 100 mg by mouth daily.     gabapentin  (NEURONTIN ) 300 MG capsule Take 1 capsule (300 mg total) by mouth 3 (three) times daily. 270 capsule 3   losartan (COZAAR) 50 MG tablet Take 50 mg by mouth daily.     Menaquinone-7 (VITAMIN K2) 100 MCG CAPS Take 100 mg by mouth daily.     Multiple Vitamins-Minerals (MULTIVITAMIN ADULT PO) Take by mouth.     NP THYROID  60 MG tablet Take 60 mg by mouth daily before breakfast.  rosuvastatin (CRESTOR) 20 MG tablet Take 20 mg by mouth 2 (two) times a week.     VITAMIN D PO Take by mouth.     Zinc Acetate, Oral, (ZINC ACETATE PO) Take by mouth.     No current facility-administered medications for this visit.    PHYSICAL  EXAMINATION: ECOG PERFORMANCE STATUS: 1 - Symptomatic but completely ambulatory  Vitals:   10/05/23 1456  BP: (!) 148/72  Pulse: 75  Resp: 18  Temp: 97.7 F (36.5 C)  SpO2: 100%   Filed Weights   10/05/23 1456  Weight: 174 lb 6.4 oz (79.1 kg)    Physical Exam No palpable lumps or nodules in the right chest wall or left breast or axilla  (exam performed in the presence of a chaperone)  LABORATORY DATA:  I have reviewed the data as listed    Latest Ref Rng & Units 04/07/2023    4:37 PM 05/18/2022   11:33 AM 05/08/2021    1:52 PM  CMP  Glucose 70 - 99 mg/dL 578  469  629   BUN 8 - 23 mg/dL 16  24  15    Creatinine 0.44 - 1.00 mg/dL 5.28  4.13  2.44   Sodium 135 - 145 mmol/L 132  139  139   Potassium 3.5 - 5.1 mmol/L 4.5  3.9  3.9   Chloride 98 - 111 mmol/L 97  105  103   CO2 22 - 32 mmol/L 27  26  23    Calcium 8.9 - 10.3 mg/dL 9.7  01.0  9.5   Total Protein 6.5 - 8.1 g/dL 7.5  7.1  7.7   Total Bilirubin <1.2 mg/dL 0.7  0.6  0.5   Alkaline Phos 38 - 126 U/L 56  75  71   AST 15 - 41 U/L 30  28  31    ALT 0 - 44 U/L 26  19  23      Lab Results  Component Value Date   WBC 7.3 04/07/2023   HGB 13.9 04/07/2023   HCT 41.2 04/07/2023   MCV 90.5 04/07/2023   PLT 227 04/07/2023   NEUTROABS 5.3 04/07/2023    ASSESSMENT & PLAN:  Malignant neoplasm of upper-outer quadrant of right breast in female, estrogen receptor positive (HCC) 07/30/2004: Right lumpectomy and sentinel lymph node biopsy T1c N0 stage Ia grade 1 IDC ER/PR positive HER2 negative Ki-67 7% status post radiation, intolerant of tamoxifen, exemestane x 5 years 08/17/2017: Recurrence: Grade 2 IDC ER/PR positive HER2 negative Ki-67 25%, secondary of concern 0.4 cm 09/27/2017: Right mastectomy: Multifocal IDC grade 2, 2.4 cm and 0.5 cm, margins negative, 0/2 lymph nodes, T2 N0, reconstruction with implant 09/08/2017: Genetic testing: Negative History of von Willebrand disease and DES intrauterine exposure 03/12/2023: Soft  tissue mass right chest wall: Involved by breast cancer involving epidermis with ulceration, margins negative, ER 99%, PR 100%, HER2 0 02/24/2023: CT chest: No evidence of metastatic disease 04/09/2023: U/s chest: benign   Treatment plan: Adjuvant antiestrogen therapy indefinitely: We discussed on the role of antiestrogen therapy with CDK 4/6 inhibitors Guardant reveal  Chronic Pain   Remarkable improvement with gabapentin  300 mg 3 times a day  Breast cancer surveillance: Mammogram left breast: Benign Patient is undergoing guardant reveal testing ------------------------------------- Assessment and Plan Assessment & Plan Malignant neoplasm of right breast Estrogen receptor-positive neoplasm in the upper-outer quadrant of the right breast. Asymptomatic with no recurrence. Blood test due for monitoring. - Schedule blood test and communicate results. - Provided information  on special bras to alleviate discomfort.  Chronic pain Chronic chest pain well-managed with gabapentin . No side effects. Pain recurs if dose missed. Potential dose reduction if nerve pain subsides. - Continue gabapentin  300 mg oral tid. - Ensure refills available until November. - Provided information on special bras to alleviate discomfort.      No orders of the defined types were placed in this encounter.  The patient has a good understanding of the overall plan. she agrees with it. she will call with any problems that may develop before the next visit here. Total time spent: 30 mins including face to face time and time spent for planning, charting and co-ordination of care   Margert Sheerer, MD 10/05/23

## 2023-10-27 ENCOUNTER — Encounter: Payer: Self-pay | Admitting: *Deleted

## 2023-10-27 NOTE — Progress Notes (Signed)
Per MD request RN placed call to pt with recent Guardant Reveal results being negative.  No answer, LVM for pt to return call to the office.

## 2023-10-29 ENCOUNTER — Encounter: Payer: Self-pay | Admitting: Hematology and Oncology

## 2023-10-30 DIAGNOSIS — E039 Hypothyroidism, unspecified: Secondary | ICD-10-CM | POA: Diagnosis not present

## 2023-10-30 DIAGNOSIS — Z853 Personal history of malignant neoplasm of breast: Secondary | ICD-10-CM | POA: Diagnosis not present

## 2023-10-30 DIAGNOSIS — I10 Essential (primary) hypertension: Secondary | ICD-10-CM | POA: Diagnosis not present

## 2023-10-30 DIAGNOSIS — E785 Hyperlipidemia, unspecified: Secondary | ICD-10-CM | POA: Diagnosis not present

## 2023-11-23 DIAGNOSIS — Z01419 Encounter for gynecological examination (general) (routine) without abnormal findings: Secondary | ICD-10-CM | POA: Diagnosis not present

## 2023-11-23 DIAGNOSIS — Z9189 Other specified personal risk factors, not elsewhere classified: Secondary | ICD-10-CM | POA: Diagnosis not present

## 2023-12-30 DIAGNOSIS — I10 Essential (primary) hypertension: Secondary | ICD-10-CM | POA: Diagnosis not present

## 2023-12-30 DIAGNOSIS — E785 Hyperlipidemia, unspecified: Secondary | ICD-10-CM | POA: Diagnosis not present

## 2023-12-30 DIAGNOSIS — Z853 Personal history of malignant neoplasm of breast: Secondary | ICD-10-CM | POA: Diagnosis not present

## 2023-12-30 DIAGNOSIS — E039 Hypothyroidism, unspecified: Secondary | ICD-10-CM | POA: Diagnosis not present

## 2024-01-03 DIAGNOSIS — L570 Actinic keratosis: Secondary | ICD-10-CM | POA: Diagnosis not present

## 2024-01-03 DIAGNOSIS — D0439 Carcinoma in situ of skin of other parts of face: Secondary | ICD-10-CM | POA: Diagnosis not present

## 2024-01-25 DIAGNOSIS — M8589 Other specified disorders of bone density and structure, multiple sites: Secondary | ICD-10-CM | POA: Diagnosis not present

## 2024-01-25 DIAGNOSIS — Z1231 Encounter for screening mammogram for malignant neoplasm of breast: Secondary | ICD-10-CM | POA: Diagnosis not present

## 2024-02-03 ENCOUNTER — Other Ambulatory Visit: Payer: Self-pay | Admitting: Hematology and Oncology

## 2024-02-23 DIAGNOSIS — C4401 Basal cell carcinoma of skin of lip: Secondary | ICD-10-CM | POA: Diagnosis not present

## 2024-03-28 DIAGNOSIS — I10 Essential (primary) hypertension: Secondary | ICD-10-CM | POA: Diagnosis not present

## 2024-03-28 DIAGNOSIS — E039 Hypothyroidism, unspecified: Secondary | ICD-10-CM | POA: Diagnosis not present

## 2024-03-28 DIAGNOSIS — E785 Hyperlipidemia, unspecified: Secondary | ICD-10-CM | POA: Diagnosis not present

## 2024-03-30 DIAGNOSIS — M25551 Pain in right hip: Secondary | ICD-10-CM | POA: Diagnosis not present

## 2024-04-14 ENCOUNTER — Other Ambulatory Visit: Payer: Self-pay | Admitting: Hematology and Oncology

## 2024-04-27 DIAGNOSIS — C50411 Malignant neoplasm of upper-outer quadrant of right female breast: Secondary | ICD-10-CM | POA: Diagnosis not present

## 2024-04-28 ENCOUNTER — Encounter: Payer: Self-pay | Admitting: *Deleted

## 2024-05-01 ENCOUNTER — Encounter: Payer: Self-pay | Admitting: Hematology and Oncology

## 2024-06-05 ENCOUNTER — Encounter: Payer: Self-pay | Admitting: Hematology and Oncology

## 2024-06-19 ENCOUNTER — Other Ambulatory Visit: Payer: Self-pay | Admitting: *Deleted

## 2024-06-19 MED ORDER — GABAPENTIN 100 MG PO CAPS: 200.0000 mg | ORAL_CAPSULE | Freq: Three times a day (TID) | ORAL | 1 refills | Status: AC

## 2024-06-19 NOTE — Progress Notes (Signed)
 Received mychart message from pt requesting to taper down on Gabapentin .  Pt currently taking 300 mg p.o TID and is experiencing brain fog, slowed cognitive abilities and general dullness.  RN reviewed with MD and verbal orders received and placed to prescribe pt Gabapentin  200 mg p.o TID and may decrease to 100 mg p.o TID if needed.  Prescription sent to pharmacy on file, pt educated and verbalized understanding.

## 2024-06-23 NOTE — Progress Notes (Signed)
 Catherine Robbins                                          MRN: [id number]   06/23/2024   The [facility] Quality Team Specialist reviewed this patient medical record for the purposes of chart review for care gap closure. The following were reviewed: chart review for care gap closure-controlling blood pressure.    [facility] Quality Team

## 2024-10-04 ENCOUNTER — Inpatient Hospital Stay: Admitting: Hematology and Oncology
